# Patient Record
Sex: Male | Born: 1990 | Race: White | Hispanic: No | Marital: Single | Smoking: Current every day smoker
Health system: Southern US, Community
[De-identification: ages and names within clinical notes are randomized; demographics above are authoritative.]

## PROBLEM LIST (undated history)

## (undated) DIAGNOSIS — N200 Calculus of kidney: Secondary | ICD-10-CM

## (undated) DIAGNOSIS — F1910 Other psychoactive substance abuse, uncomplicated: Secondary | ICD-10-CM

## (undated) HISTORY — PX: LITHOTRIPSY: SUR834

---
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
## 2011-12-29 ENCOUNTER — Emergency Department (HOSPITAL_COMMUNITY): Payer: Self-pay

## 2011-12-29 ENCOUNTER — Encounter (HOSPITAL_COMMUNITY): Payer: Self-pay | Admitting: *Deleted

## 2011-12-29 ENCOUNTER — Emergency Department (HOSPITAL_COMMUNITY)
Admission: EM | Admit: 2011-12-29 | Discharge: 2011-12-29 | Disposition: A | Discharge status: Home / Self Care | Payer: Self-pay | Attending: Emergency Medicine | Admitting: Emergency Medicine

## 2011-12-29 DIAGNOSIS — R109 Unspecified abdominal pain: Secondary | ICD-10-CM | POA: Insufficient documentation

## 2011-12-29 DIAGNOSIS — N201 Calculus of ureter: Secondary | ICD-10-CM | POA: Insufficient documentation

## 2011-12-29 HISTORY — DX: Calculus of kidney: N20.0

## 2011-12-29 LAB — CBC WITH DIFFERENTIAL/PLATELET
Eosinophils Absolute: 0.1 10*3/uL (ref 0.0–0.7)
Eosinophils Relative: 1 % (ref 0–5)
Hemoglobin: 15.4 g/dL (ref 13.0–17.0)
Lymphs Abs: 1.6 10*3/uL (ref 0.7–4.0)
MCH: 29.7 pg (ref 26.0–34.0)
MCV: 86.7 fL (ref 78.0–100.0)
Monocytes Absolute: 0.6 10*3/uL (ref 0.1–1.0)
Monocytes Relative: 4 % (ref 3–12)
RBC: 5.18 MIL/uL (ref 4.22–5.81)

## 2011-12-29 LAB — BASIC METABOLIC PANEL
CO2: 28 mEq/L (ref 19–32)
Calcium: 10.8 mg/dL — ABNORMAL HIGH (ref 8.4–10.5)
Glucose, Bld: 134 mg/dL — ABNORMAL HIGH (ref 70–99)
Potassium: 3.9 mEq/L (ref 3.5–5.1)
Sodium: 138 mEq/L (ref 135–145)

## 2011-12-29 LAB — URINALYSIS, ROUTINE W REFLEX MICROSCOPIC
Bilirubin Urine: NEGATIVE
Glucose, UA: NEGATIVE mg/dL

## 2011-12-29 MED ORDER — SODIUM CHLORIDE 0.9 % IV SOLN: INTRAVENOUS | Status: DC

## 2011-12-29 MED ORDER — TAMSULOSIN HCL 0.4 MG PO CAPS: 0.4000 mg | ORAL_CAPSULE | Freq: Two times a day (BID) | ORAL | Status: DC

## 2011-12-29 MED ORDER — KETOROLAC TROMETHAMINE 30 MG/ML IJ SOLN
30.0000 mg | Freq: Once | INTRAMUSCULAR | Status: AC
  Administered 2011-12-29: 30 mg via INTRAVENOUS
  Filled 2011-12-29: qty 1

## 2011-12-29 MED ORDER — ONDANSETRON HCL 4 MG/2ML IJ SOLN
4.0000 mg | Freq: Once | INTRAMUSCULAR | Status: AC
  Administered 2011-12-29: 4 mg via INTRAVENOUS
  Filled 2011-12-29: qty 2

## 2011-12-29 MED ORDER — HYDROMORPHONE HCL PF 1 MG/ML IJ SOLN
1.0000 mg | Freq: Once | INTRAMUSCULAR | Status: AC
  Administered 2011-12-29: 1 mg via INTRAVENOUS
  Filled 2011-12-29: qty 1

## 2011-12-29 MED ORDER — PROMETHAZINE HCL 25 MG PO TABS: 25.0000 mg | ORAL_TABLET | Freq: Four times a day (QID) | ORAL | Status: DC | PRN

## 2011-12-29 MED ORDER — SODIUM CHLORIDE 0.9 % IV BOLUS (SEPSIS): 250.0000 mL | Freq: Once | INTRAVENOUS | Status: DC

## 2011-12-29 MED ORDER — IBUPROFEN 800 MG PO TABS: 800.0000 mg | ORAL_TABLET | Freq: Three times a day (TID) | ORAL | Status: AC

## 2011-12-29 MED ORDER — OXYCODONE-ACETAMINOPHEN 5-325 MG PO TABS: 1.0000 | ORAL_TABLET | Freq: Four times a day (QID) | ORAL | Status: AC | PRN

## 2011-12-29 NOTE — ED Notes (Signed)
Family at bedside. 

## 2011-12-29 NOTE — ED Notes (Signed)
Discharge instructions reviewed with pt, questions answered. Pt verbalized understanding.  

## 2011-12-29 NOTE — ED Notes (Signed)
Pain rt flank and pain in testicle. Nausea, vomiting,x3,  No injury

## 2011-12-29 NOTE — ED Provider Notes (Signed)
History     CSN: [id number]  Arrival date & time 12/29/11  4098   First MD Initiated Contact with Patient 12/29/11 1947      Chief Complaint  Patient presents with  . Flank Pain    (Consider location/radiation/quality/duration/timing/severity/associated sxs/prior treatment) Patient is a 21 y.o. male presenting with flank pain. The history is provided by the patient.  Flank Pain This is a new problem. The current episode started 3 to 5 hours ago. The problem occurs constantly. The problem has been gradually worsening. Associated symptoms include abdominal pain. Pertinent negatives include no chest pain, no headaches and no shortness of breath. Nothing aggravates the symptoms. Nothing relieves the symptoms.   Patient with onset of right flank pain radiating into the right testicle at 1430 today. Patient had a history kidney stone in the past. Patient has vomited 3 times has nausea no diarrhea. No history of injury. Pain is 10 out of 10 as stated radiates into the right testicle. Pain is described as sharp and colicky. Past Medical History  Diagnosis Date  . Kidney stone     History reviewed. No pertinent past surgical history.  History reviewed. No pertinent family history.  History  Substance Use Topics  . Smoking status: Never Smoker   . Smokeless tobacco: Not on file  . Alcohol Use: Yes      Review of Systems  Constitutional: Negative for fever.  HENT: Negative for congestion and neck pain.   Eyes: Negative for redness.  Respiratory: Negative for shortness of breath.   Cardiovascular: Negative for chest pain.  Gastrointestinal: Positive for nausea, vomiting and abdominal pain. Negative for diarrhea.  Genitourinary: Positive for flank pain. Negative for dysuria and hematuria.  Musculoskeletal: Negative for back pain.  Skin: Negative for rash.  Neurological: Negative for headaches.  Hematological: Does not bruise/bleed easily.    Allergies  Review of patient's  allergies indicates no known allergies.  Home Medications   Current Outpatient Rx  Name Route Sig Dispense Refill  . IBUPROFEN 800 MG PO TABS Oral Take 1 tablet (800 mg total) by mouth 3 (three) times daily. 30 tablet 0  . OXYCODONE-ACETAMINOPHEN 5-325 MG PO TABS Oral Take 1-2 tablets by mouth every 6 (six) hours as needed for pain. 15 tablet 0  . PROMETHAZINE HCL 25 MG PO TABS Oral Take 1 tablet (25 mg total) by mouth every 6 (six) hours as needed for nausea. 12 tablet 0    BP 161/94  Pulse 88  Temp 98.7 F (37.1 C) (Oral)  Resp 18  Ht 5\' 7"  (1.702 m)  Wt 145 lb (65.772 kg)  BMI 22.71 kg/m2  SpO2 100%  Physical Exam  Nursing note and vitals reviewed. Constitutional: He is oriented to person, place, and time. He appears well-developed and well-nourished. No distress.  HENT:  Head: Normocephalic and atraumatic.  Mouth/Throat: Oropharynx is clear and moist.  Eyes: Conjunctivae and EOM are normal. Pupils are equal, round, and reactive to light.  Neck: Normal range of motion. Neck supple.  Cardiovascular: Normal rate, regular rhythm and normal heart sounds.   No murmur heard. Pulmonary/Chest: Effort normal and breath sounds normal.  Abdominal: Soft. Bowel sounds are normal. There is no tenderness.  Musculoskeletal: Normal range of motion. He exhibits no edema and no tenderness.  Neurological: He is alert and oriented to person, place, and time. No cranial nerve deficit. He exhibits normal muscle tone. Coordination normal.  Skin: Skin is warm. No rash noted.    ED Course  Procedures (including critical care time)  Labs Reviewed  URINALYSIS, ROUTINE W REFLEX MICROSCOPIC - Abnormal; Notable for the following:    Color, Urine AMBER (*)  BIOCHEMICALS MAY BE AFFECTED BY COLOR   Specific Gravity, Urine >1.030 (*)     Hgb urine dipstick LARGE (*)     Protein, ur TRACE (*)     All other components within normal limits  BASIC METABOLIC PANEL - Abnormal; Notable for the following:      Glucose, Bld 134 (*)     Calcium 10.8 (*)     GFR calc non Af Amer 78 (*)     All other components within normal limits  CBC WITH DIFFERENTIAL - Abnormal; Notable for the following:    WBC 13.6 (*)     Neutrophils Relative 83 (*)     Neutro Abs 11.3 (*)     All other components within normal limits  URINE MICROSCOPIC-ADD ON - Abnormal; Notable for the following:    Bacteria, UA FEW (*)     All other components within normal limits   Ct Abdomen Pelvis Wo Contrast  12/29/2011  *RADIOLOGY REPORT*  Clinical Data: Right flank pain  CT ABDOMEN AND PELVIS WITHOUT CONTRAST  Technique:  Multidetector CT imaging of the abdomen and pelvis was performed following the standard protocol without intravenous contrast.  Comparison: None.  Findings: Lung bases are clear.  No pericardial fluid.  No focal hepatic lesion.  The gallbladder, pancreas, spleen, and adrenal glands are normal.  There is moderate to severe hydronephrosis of the right kidney. There is moderate to severe hydroureter on the right.  This is secondary to an 7 mm calculus within the mid right ureter.  This right ureteral calculus is at the L5 vertebral body level.  This stone is evident on the CT scout.  No left nephrolithiasis.  No bladder stones.  The stomach and bowel are normal.  Appendix is normal.  No free fluid the pelvis.  Prostate gland is normal.  No aggressive osseous lesions.  IMPRESSION:  Large obstructing calculus in the distal right ureter with moderate to severe right hydronephrosis.  Original Report Authenticated By: Genevive Bi, M.D.   Results for orders placed during the hospital encounter of 12/29/11  URINALYSIS, ROUTINE W REFLEX MICROSCOPIC      Component Value Range   Color, Urine AMBER (*) YELLOW   APPearance CLEAR  CLEAR   Specific Gravity, Urine >1.030 (*) 1.005 - 1.030   pH 6.0  5.0 - 8.0   Glucose, UA NEGATIVE  NEGATIVE mg/dL   Hgb urine dipstick LARGE (*) NEGATIVE   Bilirubin Urine NEGATIVE  NEGATIVE    Ketones, ur NEGATIVE  NEGATIVE mg/dL   Protein, ur TRACE (*) NEGATIVE mg/dL   Urobilinogen, UA 0.2  0.0 - 1.0 mg/dL   Nitrite NEGATIVE  NEGATIVE   Leukocytes, UA NEGATIVE  NEGATIVE  BASIC METABOLIC PANEL      Component Value Range   Sodium 138  135 - 145 mEq/L   Potassium 3.9  3.5 - 5.1 mEq/L   Chloride 99  96 - 112 mEq/L   CO2 28  19 - 32 mEq/L   Glucose, Bld 134 (*) 70 - 99 mg/dL   BUN 15  6 - 23 mg/dL   Creatinine, Ser 6.21  0.50 - 1.35 mg/dL   Calcium 30.8 (*) 8.4 - 10.5 mg/dL   GFR calc non Af Amer 78 (*) >90 mL/min   GFR calc Af Amer >90  >90 mL/min  CBC WITH DIFFERENTIAL      Component Value Range   WBC 13.6 (*) 4.0 - 10.5 K/uL   RBC 5.18  4.22 - 5.81 MIL/uL   Hemoglobin 15.4  13.0 - 17.0 g/dL   HCT 82.9  56.2 - 13.0 %   MCV 86.7  78.0 - 100.0 fL   MCH 29.7  26.0 - 34.0 pg   MCHC 34.3  30.0 - 36.0 g/dL   RDW 86.5  78.4 - 69.6 %   Platelets 241  150 - 400 K/uL   Neutrophils Relative 83 (*) 43 - 77 %   Neutro Abs 11.3 (*) 1.7 - 7.7 K/uL   Lymphocytes Relative 12  12 - 46 %   Lymphs Abs 1.6  0.7 - 4.0 K/uL   Monocytes Relative 4  3 - 12 %   Monocytes Absolute 0.6  0.1 - 1.0 K/uL   Eosinophils Relative 1  0 - 5 %   Eosinophils Absolute 0.1  0.0 - 0.7 K/uL   Basophils Relative 0  0 - 1 %   Basophils Absolute 0.0  0.0 - 0.1 K/uL  URINE MICROSCOPIC-ADD ON      Component Value Range   Squamous Epithelial / LPF RARE  RARE   WBC, UA 3-6  <3 WBC/hpf   RBC / HPF TOO NUMEROUS TO COUNT  <3 RBC/hpf   Bacteria, UA FEW (*) RARE     1. Ureter, calculus       MDM   CT scan confirms 7 mm stone in the right ureteral system consistent with his symptoms. BUN/creatinine is normal the CT scan does show hydronephrosis but no evidence of renal insufficiency. Urinalysis consistent with hematuria also consistent with stone. No sniffing and anemia. Patient has a history kidney stone in the past. At 7 mm he may have difficulty passing stone urology followup will be important given  urology referral. We'll go home with pain medicine antinausea medicine and anti-inflammatory medicine. Close followup is important. Work note provided.       Shelda Jakes, MD 12/29/11 2132

## 2011-12-29 NOTE — ED Notes (Signed)
Patient is comfortable. 

## 2013-07-21 ENCOUNTER — Emergency Department (HOSPITAL_COMMUNITY): Payer: Self-pay

## 2013-07-21 ENCOUNTER — Emergency Department (HOSPITAL_COMMUNITY)
Admission: EM | Admit: 2013-07-21 | Discharge: 2013-07-21 | Disposition: A | Discharge status: Home / Self Care | Payer: Self-pay | Attending: Emergency Medicine | Admitting: Emergency Medicine

## 2013-07-21 ENCOUNTER — Encounter (HOSPITAL_COMMUNITY): Payer: Self-pay | Admitting: Emergency Medicine

## 2013-07-21 DIAGNOSIS — N1330 Unspecified hydronephrosis: Secondary | ICD-10-CM | POA: Insufficient documentation

## 2013-07-21 DIAGNOSIS — Z87442 Personal history of urinary calculi: Secondary | ICD-10-CM | POA: Insufficient documentation

## 2013-07-21 DIAGNOSIS — N201 Calculus of ureter: Secondary | ICD-10-CM | POA: Insufficient documentation

## 2013-07-21 DIAGNOSIS — N132 Hydronephrosis with renal and ureteral calculous obstruction: Secondary | ICD-10-CM

## 2013-07-21 LAB — BASIC METABOLIC PANEL
BUN: 17 mg/dL (ref 6–23)
CO2: 29 mEq/L (ref 19–32)
CREATININE: 1.01 mg/dL (ref 0.50–1.35)
Calcium: 9.8 mg/dL (ref 8.4–10.5)
Chloride: 103 mEq/L (ref 96–112)
GLUCOSE: 98 mg/dL (ref 70–99)
POTASSIUM: 4.1 meq/L (ref 3.7–5.3)
Sodium: 141 mEq/L (ref 137–147)

## 2013-07-21 LAB — URINALYSIS, ROUTINE W REFLEX MICROSCOPIC
BILIRUBIN URINE: NEGATIVE
Glucose, UA: NEGATIVE mg/dL
KETONES UR: NEGATIVE mg/dL
Leukocytes, UA: NEGATIVE
NITRITE: NEGATIVE
Protein, ur: NEGATIVE mg/dL
SPECIFIC GRAVITY, URINE: 1.01 (ref 1.005–1.030)
UROBILINOGEN UA: 0.2 mg/dL (ref 0.0–1.0)
pH: 8.5 — ABNORMAL HIGH (ref 5.0–8.0)

## 2013-07-21 LAB — URINE MICROSCOPIC-ADD ON

## 2013-07-21 MED ORDER — MORPHINE SULFATE 4 MG/ML IJ SOLN
4.0000 mg | Freq: Once | INTRAMUSCULAR | Status: AC
  Administered 2013-07-21: 4 mg via INTRAVENOUS
  Filled 2013-07-21: qty 1

## 2013-07-21 MED ORDER — SODIUM CHLORIDE 0.9 % IV BOLUS (SEPSIS)
1000.0000 mL | Freq: Once | INTRAVENOUS | Status: AC
  Administered 2013-07-21: 1000 mL via INTRAVENOUS

## 2013-07-21 MED ORDER — TAMSULOSIN HCL 0.4 MG PO CAPS: 0.4000 mg | ORAL_CAPSULE | Freq: Every day | ORAL | Status: DC

## 2013-07-21 MED ORDER — ONDANSETRON HCL 4 MG/2ML IJ SOLN
4.0000 mg | Freq: Once | INTRAMUSCULAR | Status: AC
  Administered 2013-07-21: 4 mg via INTRAVENOUS
  Filled 2013-07-21: qty 2

## 2013-07-21 MED ORDER — TAMSULOSIN HCL 0.4 MG PO CAPS
0.4000 mg | ORAL_CAPSULE | Freq: Once | ORAL | Status: DC
Start: 2013-07-21 — End: 2013-07-21
  Filled 2013-07-21: qty 1

## 2013-07-21 MED ORDER — OXYCODONE-ACETAMINOPHEN 5-325 MG PO TABS: 1.0000 | ORAL_TABLET | ORAL | Status: DC | PRN

## 2013-07-21 NOTE — ED Provider Notes (Signed)
CSN: [id number]     Arrival date & time 07/21/13  1014 History   First MD Initiated Contact with Patient 07/21/13 1057     Chief Complaint  Patient presents with  . Back Pain     (Consider location/radiation/quality/duration/timing/severity/associated sxs/prior Treatment) HPI Comments: Lynnae JanuaryWilliam Schillaci is a 23 y.o. Male presenting with a three-day history of left sided moderate constant flank pain radiating into his lower left abdomen in association with increased urinary frequency of small amounts of urine.  He denies fevers or chills, no vomiting but has had nausea during episodes of increased sharp pain.  He denies hematuria but has had increased urinary frequency of a small amount of urine.  He has a history of kidney stones, most recently passed a 7 mm stone almost 2 years ago without intervention.  He [facility] [place] with his last episode of kidney stone.  He has found no alleviators for his symptoms. He took naproxen without relief.     The history is provided by the patient and the spouse.    Past Medical History  Diagnosis Date  . Kidney stone    History reviewed. No pertinent past surgical history. History reviewed. No pertinent family history. History  Substance Use Topics  . Smoking status: Never Smoker   . Smokeless tobacco: Never Used  . Alcohol Use: 3.0 oz/week    5 Shots of liquor per week    Review of Systems  Constitutional: Negative for fever and chills.  HENT: Negative for congestion and sore throat.   Eyes: Negative.   Respiratory: Negative for chest tightness and shortness of breath.   Cardiovascular: Negative for chest pain.  Gastrointestinal: Negative for nausea and abdominal pain.  Genitourinary: Positive for dysuria, frequency and flank pain. Negative for urgency, hematuria and discharge.  Musculoskeletal: Negative for arthralgias, joint swelling and neck pain.  Skin: Negative.  Negative for rash and wound.   Neurological: Negative for dizziness, weakness, light-headedness, numbness and headaches.  Psychiatric/Behavioral: Negative.       Allergies  Review of patient's allergies indicates no known allergies.  Home Medications   Current Outpatient Rx  Name  Route  Sig  Dispense  Refill  . naproxen sodium (ANAPROX) 220 MG tablet   Oral   Take 440 mg by mouth 2 (two) times daily as needed.         Marland Kitchen. oxyCODONE-acetaminophen (PERCOCET/ROXICET) 5-325 MG per tablet   Oral   Take 1 tablet by mouth every 4 (four) hours as needed for severe pain.   20 tablet   0   . tamsulosin (FLOMAX) 0.4 MG CAPS capsule   Oral   Take 1 capsule (0.4 mg total) by mouth daily after supper.   10 capsule   0    BP 171/75  Pulse 71  Temp(Src) 98.2 F (36.8 C) (Oral)  Resp 20  Ht 5\' 6"  (1.676 m)  Wt 150 lb (68.04 kg)  BMI 24.22 kg/m2  SpO2 100% Physical Exam  Nursing note and vitals reviewed. Constitutional: He appears well-developed and well-nourished.  HENT:  Head: Normocephalic and atraumatic.  Eyes: Conjunctivae are normal.  Neck: Normal range of motion.  Cardiovascular: Normal rate, regular rhythm, normal heart sounds and intact distal pulses.   Pulmonary/Chest: Effort normal and breath sounds normal. He has no wheezes.  Abdominal: Soft. Bowel sounds are normal. He exhibits no distension. There is tenderness in the left lower quadrant. There is no rigidity, no rebound, no guarding and no  CVA tenderness.  Musculoskeletal: Normal range of motion.  Neurological: He is alert.  Skin: Skin is warm and dry.  Psychiatric: He has a normal mood and affect.    ED Course  Procedures (including critical care time) Labs Review Labs Reviewed  URINALYSIS, ROUTINE W REFLEX MICROSCOPIC - Abnormal; Notable for the following:    APPearance CLOUDY (*)    pH 8.5 (*)    Hgb urine dipstick TRACE (*)    All other components within normal limits  BASIC METABOLIC PANEL  URINE MICROSCOPIC-ADD ON   Imaging  Review Ct Abdomen Pelvis Wo Contrast  07/21/2013   CLINICAL DATA:  Left flank pain since February 27th  EXAM: CT ABDOMEN AND PELVIS WITHOUT CONTRAST  TECHNIQUE: Multidetector CT imaging of the abdomen and pelvis was performed following the standard protocol without intravenous contrast.  COMPARISON:  DG ABDOMEN 1V dated 03/07/2012; CT ABD/PELV WO CM dated 12/29/2011  FINDINGS: Visualized lung bases are clear.  No acute musculoskeletal findings.  Liver, gallbladder, spleen, pancreas, adrenal glands normal. Right kidney demonstrates to lower pole 1 cm rounded areas of low attenuation. Left kidney demonstrates moderately severe hydronephrosis. There is a stone at the ureterovesical junction measuring 8 mm. Bladder is otherwise normal. Reproductive organs are normal. Bowel is normal. No ascites or adenopathy.  IMPRESSION: 1. Moderately severe hydronephrosis on the left due to an 8 mm stone in the left ureterovesical junction. 2. Two low-attenuation lesions lower pole right kidney. These are not seen on prior CT scan. They may represent cysts. Potentially they may have been present but obscured by the hydronephrosis that was present on the right on the prior study. They could be further characterized with ultrasound. A contrast-enhanced study could also further characterize these lesions.   Electronically Signed   By: Esperanza Heir M.D.   On: 07/21/2013 12:22     EKG Interpretation None      MDM   Final diagnoses:  Ureteral stone with hydronephrosis    Patients labs and/or radiological studies were viewed and considered during the medical decision making and disposition process. Pt with a left distal ureteral stone measuring 8 mm with hydronephrosis.  Pt was given a dose of flomax, prescription for additional doses.  Percocet, encouraged to continue with fluid intake and to strain urine.  Multiple attempts to contact [facility] with no response.  Pt and wife advised to contact their office for an  office visit this week.  Advised they have a [place] office tomorrow and on Friday.  In the interim,  Advised immediate return here for increased pain not relieved by medication or fevers/ vomiting, needs immediate recheck.  Pt understands plan.  Urine cx was ordered. Also discussed possible cysts found in right kidney which will require [place] for further eval - pt to discuss with urology this week.  The patient appears reasonably screened and/or stabilized for discharge and I doubt any other medical condition or other [facility] requiring further screening, evaluation, or treatment in the ED at this time prior to discharge.     Burgess Amor, PA-C 07/21/13 [phone]

## 2013-07-21 NOTE — Discharge Instructions (Signed)
Kidney Stones Kidney stones (urolithiasis) are deposits that form inside your kidneys. The intense pain is caused by the stone moving through the urinary tract. When the stone moves, the ureter goes into spasm around the stone. The stone is usually passed in the urine.  CAUSES   A disorder that makes certain neck glands produce too much parathyroid hormone (primary hyperparathyroidism).  A buildup of uric acid crystals, similar to gout in your joints.  Narrowing (stricture) of the ureter.  A kidney obstruction present at birth (congenital obstruction).  Previous surgery on the kidney or ureters.  Numerous kidney infections. SYMPTOMS   Feeling sick to your stomach (nauseous).  Throwing up (vomiting).  Blood in the urine (hematuria).  Pain that usually spreads (radiates) to the groin.  Frequency or urgency of urination. DIAGNOSIS   Taking a history and physical exam.  Blood or urine tests.  CT scan.  Occasionally, an examination of the inside of the urinary bladder (cystoscopy) is performed. TREATMENT   Observation.  Increasing your fluid intake.  Extracorporeal shock wave lithotripsy This is a noninvasive procedure that uses shock waves to break up kidney stones.  Surgery may be needed if you have severe pain or persistent obstruction. There are various surgical procedures. Most of the procedures are performed with the use of small instruments. Only small incisions are needed to accommodate these instruments, so recovery time is minimized. The size, location, and chemical composition are all important variables that will determine the proper choice of action for you. Talk to your health care provider to better understand your situation so that you will minimize the risk of injury to yourself and your kidney.  HOME CARE INSTRUCTIONS   Drink enough water and fluids to keep your urine clear or pale yellow. This will help you to pass the stone or stone fragments.  Strain  all urine through the provided strainer. Keep all particulate matter and stones for your health care provider to see. The stone causing the pain may be as small as a grain of salt. It is very important to use the strainer each and every time you pass your urine. The collection of your stone will allow your health care provider to analyze it and verify that a stone has actually passed. The stone analysis will often identify what you can do to reduce the incidence of recurrences.  Only take over-the-counter or prescription medicines for pain, discomfort, or fever as directed by your health care provider.  Make a follow-up appointment with your health care provider as directed.  Get follow-up X-rays if required. The absence of pain does not always mean that the stone has passed. It may have only stopped moving. If the urine remains completely obstructed, it can cause loss of kidney function or even complete destruction of the kidney. It is your responsibility to make sure X-rays and follow-ups are completed. Ultrasounds of the kidney can show blockages and the status of the kidney. Ultrasounds are not associated with any radiation and can be performed easily in a matter of minutes. SEEK MEDICAL CARE IF:  You experience pain that is progressive and unresponsive to any pain medicine you have been prescribed. SEEK IMMEDIATE MEDICAL CARE IF:   Pain cannot be controlled with the prescribed medicine.  You have a fever or shaking chills.  The severity or intensity of pain increases over 18 hours and is not relieved by pain medicine.  You develop a new onset of abdominal pain.  You feel faint or pass  out.  You are unable to urinate. MAKE SURE YOU:   Understand these instructions.  Will watch your condition.  Will get help right away if you are not doing well or get worse. Document Released: 05/08/2005 Document Revised: 01/08/2013 Document Reviewed: 10/09/2012 [facility] Patient Information 2014  [place] [place]   Strain your urine so you will know if you pass this stone.  Take your next dose of flomax tomorrow evening.  You may take the oxycodone prescribed for pain relief.  This will make you drowsy - do not drive within 4 hours of taking this medication. Get rechecked immediately for any fevers, worse pain or uncontrolled vomiting.  Call [facility] for followup care this week, either in [place] or their [place] office (they are in [place] on Tuesday and Friday).  As discussed,  You have what appears to be a small cyst also in your right kidney which should be evaluated further by an ultrasound. Please discuss this with your urologist this week.

## 2013-07-21 NOTE — ED Notes (Signed)
Patient c/o left back pain that radiates into left flank and left lower abd that started on Friday. Per patient has frequency to urinate but only voids small amounts. Denies any blood in urine. Per patient hx of kidney stones.

## 2013-07-22 LAB — URINE CULTURE
COLONY COUNT: NO GROWTH
Culture: NO GROWTH

## 2013-07-22 NOTE — ED Provider Notes (Signed)
Medical screening examination/treatment/procedure(s) were performed by non-physician practitioner and as supervising physician I was immediately available for consultation/collaboration.   EKG Interpretation None       Doug SouSam Nettie Cromwell, MD 07/22/13 [phone]

## 2013-07-22 NOTE — Care Management (Signed)
Delayed entry 07/21/13 ED/CM noted patient did not have health insurance and/or PCP listed in the computer.  Patient was given the [facility] resource handout with information on the clinics, food pantries, and the handout for new health insurance sign-up.  Patient expressed appreciation for information received.

## 2013-07-25 ENCOUNTER — Emergency Department (HOSPITAL_COMMUNITY): Payer: Self-pay

## 2013-07-25 ENCOUNTER — Emergency Department (HOSPITAL_COMMUNITY)
Admission: EM | Admit: 2013-07-25 | Discharge: 2013-07-26 | Disposition: A | Discharge status: Home / Self Care | Payer: Self-pay | Attending: Emergency Medicine | Admitting: Emergency Medicine

## 2013-07-25 ENCOUNTER — Encounter (HOSPITAL_COMMUNITY): Payer: Self-pay | Admitting: Emergency Medicine

## 2013-07-25 DIAGNOSIS — N211 Calculus in urethra: Secondary | ICD-10-CM

## 2013-07-25 DIAGNOSIS — N201 Calculus of ureter: Secondary | ICD-10-CM | POA: Insufficient documentation

## 2013-07-25 DIAGNOSIS — F411 Generalized anxiety disorder: Secondary | ICD-10-CM | POA: Insufficient documentation

## 2013-07-25 DIAGNOSIS — Z79899 Other long term (current) drug therapy: Secondary | ICD-10-CM | POA: Insufficient documentation

## 2013-07-25 LAB — BASIC METABOLIC PANEL
BUN: 24 mg/dL — ABNORMAL HIGH (ref 6–23)
CO2: 29 meq/L (ref 19–32)
Calcium: 10.1 mg/dL (ref 8.4–10.5)
Chloride: 100 mEq/L (ref 96–112)
Creatinine, Ser: 1.11 mg/dL (ref 0.50–1.35)
GFR calc Af Amer: >90 mL/min (ref >90)
Glucose, Bld: 94 mg/dL (ref 70–99)
Potassium: 3.8 mEq/L (ref 3.7–5.3)
SODIUM: 141 meq/L (ref 137–147)

## 2013-07-25 LAB — URINALYSIS, ROUTINE W REFLEX MICROSCOPIC
Bilirubin Urine: NEGATIVE
Glucose, UA: NEGATIVE mg/dL
Hgb urine dipstick: NEGATIVE
KETONES UR: NEGATIVE mg/dL
LEUKOCYTES UA: NEGATIVE
Nitrite: NEGATIVE
PROTEIN: NEGATIVE mg/dL
Specific Gravity, Urine: 1.02 (ref 1.005–1.030)
UROBILINOGEN UA: 0.2 mg/dL (ref 0.0–1.0)
pH: 6.5 (ref 5.0–8.0)

## 2013-07-25 LAB — CBC WITH DIFFERENTIAL/PLATELET
BASOS PCT: 0 % (ref 0–1)
Basophils Absolute: 0 10*3/uL (ref 0.0–0.1)
Eosinophils Absolute: 0.2 10*3/uL (ref 0.0–0.7)
Eosinophils Relative: 2 % (ref 0–5)
HCT: 48.1 % (ref 39.0–52.0)
Hemoglobin: 16.8 g/dL (ref 13.0–17.0)
LYMPHS PCT: 24 % (ref 12–46)
Lymphs Abs: 2.2 10*3/uL (ref 0.7–4.0)
MCH: 31.1 pg (ref 26.0–34.0)
MCHC: 34.9 g/dL (ref 30.0–36.0)
MCV: 89.1 fL (ref 78.0–100.0)
Monocytes Absolute: 0.6 10*3/uL (ref 0.1–1.0)
Monocytes Relative: 7 % (ref 3–12)
Neutro Abs: 6.3 10*3/uL (ref 1.7–7.7)
Neutrophils Relative %: 68 % (ref 43–77)
PLATELETS: 259 10*3/uL (ref 150–400)
RBC: 5.4 MIL/uL (ref 4.22–5.81)
RDW: 12 % (ref 11.5–15.5)
WBC: 9.4 10*3/uL (ref 4.0–10.5)

## 2013-07-25 MED ORDER — KETOROLAC TROMETHAMINE 30 MG/ML IJ SOLN
30.0000 mg | Freq: Once | INTRAMUSCULAR | Status: AC
  Administered 2013-07-25: 30 mg via INTRAVENOUS
  Filled 2013-07-25: qty 1

## 2013-07-25 MED ORDER — SODIUM CHLORIDE 0.9 % IV BOLUS (SEPSIS)
1000.0000 mL | Freq: Once | INTRAVENOUS | Status: AC
  Administered 2013-07-25: 1000 mL via INTRAVENOUS

## 2013-07-25 MED ORDER — HYDROMORPHONE HCL PF 1 MG/ML IJ SOLN
1.0000 mg | Freq: Once | INTRAMUSCULAR | Status: AC
  Administered 2013-07-25: 1 mg via INTRAVENOUS
  Filled 2013-07-25: qty 1

## 2013-07-25 MED ORDER — TAMSULOSIN HCL 0.4 MG PO CAPS
0.4000 mg | ORAL_CAPSULE | Freq: Once | ORAL | Status: AC
  Administered 2013-07-25: 0.4 mg via ORAL
  Filled 2013-07-25: qty 1

## 2013-07-25 MED ORDER — TAMSULOSIN HCL 0.4 MG PO CAPS
ORAL_CAPSULE | ORAL | Status: AC
  Filled 2013-07-25: qty 1

## 2013-07-25 MED ORDER — OXYCODONE-ACETAMINOPHEN 5-325 MG PO TABS: 2.0000 | ORAL_TABLET | ORAL | Status: DC | PRN

## 2013-07-25 MED ORDER — MORPHINE SULFATE 4 MG/ML IJ SOLN
4.0000 mg | Freq: Once | INTRAMUSCULAR | Status: AC
  Administered 2013-07-25: 4 mg via INTRAVENOUS
  Filled 2013-07-25: qty 1

## 2013-07-25 MED ORDER — OXYCODONE-ACETAMINOPHEN 5-325 MG PO TABS: 1.0000 | ORAL_TABLET | ORAL | Status: DC | PRN

## 2013-07-25 MED ORDER — ONDANSETRON HCL 4 MG/2ML IJ SOLN
4.0000 mg | Freq: Once | INTRAMUSCULAR | Status: AC
  Administered 2013-07-25: 4 mg via INTRAVENOUS
  Filled 2013-07-25: qty 2

## 2013-07-25 MED ORDER — ONDANSETRON HCL 4 MG PO TABS: 4.0000 mg | ORAL_TABLET | Freq: Four times a day (QID) | ORAL | Status: DC

## 2013-07-25 MED ORDER — IBUPROFEN 800 MG PO TABS: 800.0000 mg | ORAL_TABLET | Freq: Three times a day (TID) | ORAL | Status: DC

## 2013-07-25 NOTE — ED Notes (Signed)
Patient with no complaints at this time. Respirations even and unlabored. Skin warm/dry. Discharge instructions reviewed with patient at this time. Patient given opportunity to voice concerns/ask questions. IV removed per policy and band-aid applied to site. Patient discharged at this time and left Emergency Department with steady gait.  

## 2013-07-25 NOTE — ED Provider Notes (Signed)
TIME SEEN: 7:55 PM  CHIEF COMPLAINT: Dysuria, perineal pain   HPI: Patient is a 23 year old male with a history of kidney stones who presents to the emergency department with perineal pain and dysuria that started today. He was seen in the emergency department 4 days ago and diagnosed with a 8 mm left UVJ stone with moderate to severe hydronephrosis. He was instructed to follow up with [facility] which he did not do. He states this morning he woke up in a sweat with worsening pain and nausea. He states he feels like he can't urinate because it hurts to urinate. He denies any gross hematuria. No vomiting or diarrhea. Denies a prior history of ureteral stents, lithotripsy, basket retrieval. No testicular pain or scrotal swelling. No penile discharge.  ROS: See HPI Constitutional: no fever  Eyes: no drainage  ENT: no runny nose   Cardiovascular:  no chest pain  Resp: no SOB  GI: no vomiting GU:  dysuria Integumentary: no rash  Allergy: no hives  Musculoskeletal: no leg swelling  Neurological: no slurred speech ROS otherwise negative  PAST MEDICAL HISTORY/PAST SURGICAL HISTORY:  Past Medical History  Diagnosis Date  . Kidney stone     MEDICATIONS:  Prior to Admission medications   Medication Sig Start Date End Date Taking? Authorizing Provider  naproxen sodium (ANAPROX) 220 MG tablet Take 440 mg by mouth 2 (two) times daily as needed.    Historical Provider, MD  oxyCODONE-acetaminophen (PERCOCET/ROXICET) 5-325 MG per tablet Take 1 tablet by mouth every 4 (four) hours as needed for severe pain. 07/21/13   Burgess AmorJulie Idol, PA-C  tamsulosin (FLOMAX) 0.4 MG CAPS capsule Take 1 capsule (0.4 mg total) by mouth daily after supper. 07/21/13   Burgess AmorJulie Idol, PA-C    ALLERGIES:  No Known Allergies  SOCIAL HISTORY:  History  Substance Use Topics  . Smoking status: Never Smoker   . Smokeless tobacco: Never Used  . Alcohol Use: 3.0 oz/week    5 Shots of liquor per week    FAMILY  HISTORY: History reviewed. No pertinent family history.  EXAM: BP 134/100  Pulse 107  Temp(Src) 98.2 F (36.8 C) (Oral)  Resp 20  Ht 5\' 6"  (1.676 m)  Wt 150 lb (68.04 kg)  BMI 24.22 kg/m2  SpO2 100% CONSTITUTIONAL: Alert and oriented and responds appropriately to questions. Well-appearing; well-nourished HEAD: Normocephalic EYES: Conjunctivae clear, PERRL ENT: normal nose; no rhinorrhea; moist mucous membranes; pharynx without lesions noted NECK: Supple, no meningismus, no LAD  CARD: RRR; S1 and S2 appreciated; no murmurs, no clicks, no rubs, no gallops RESP: Normal chest excursion without splinting or tachypnea; breath sounds clear and equal bilaterally; no wheezes, no rhonchi, no rales,  ABD/GI: Normal bowel sounds; non-distended; soft, non-tender, no rebound, no guarding Normal external genitalia, no penile discharge, no testicular pain or testicular masses, no scrotal swelling or hernia, 2+ femoral pulses bilaterally, patient is very mildly tender to palpation in his perineum with no induration or erythema or warmth or fluctuance BACK:  The back appears normal and is non-tender to palpation, there is no CVA tenderness EXT: Normal ROM in all joints; non-tender to palpation; no edema; normal capillary refill; no cyanosis    SKIN: Normal color for age and race; warm NEURO: Moves all extremities equally PSYCH: The patient's mood and manner are appropriate. Grooming and personal hygiene are appropriate.  MEDICAL DECISION MAKING: Patient here with likely referred pain from his kidney stone. He is hemodynamically stable, nontoxic appearing. We'll repeat labs, urinalysis.  We'll give IV fluids, pain and nausea medication. If patient is unable to urinate or we cannot control his symptoms, may need repeat CT scan.   ED PROGRESS: Patient is able to urinate. His postvoid residual is 32 mL's. Suspect his feeling of urinary retention is secondary to apprehension due to dysuria.   9:30 PM Pt  with continued pain.  His labs and urine are unremarkable. Given his persistent pain it is difficult to control, will repeat a CT scan to evaluate where his 8 mm stone is located currently.   11:39 PM  Pt's pain is better controlled and he appears very comfortable. His CT scan shows a history and is in the urethra. His hydronephrosis has completely resolved. We'll discharge patient home with pain medication. I given him supportive care instructions and return precautions. He states he will followup with his urologist next week. Patient verbalizes understanding and is comfortable plan.  Layla Maw Ward, DO 07/25/13 2340

## 2013-07-25 NOTE — ED Notes (Signed)
Family at bedside. Patient states that he is in pain but can not give a number to it because as long as he is lying still it doesn't bother him.

## 2013-07-25 NOTE — Discharge Instructions (Signed)
Kidney Stones Kidney stones (urolithiasis) are deposits that form inside your kidneys. The intense pain is caused by the stone moving through the urinary tract. When the stone moves, the ureter goes into spasm around the stone. The stone is usually passed in the urine.  CAUSES   A disorder that makes certain neck glands produce too much parathyroid hormone (primary hyperparathyroidism).  A buildup of uric acid crystals, similar to gout in your joints.  Narrowing (stricture) of the ureter.  A kidney obstruction present at birth (congenital obstruction).  Previous surgery on the kidney or ureters.  Numerous kidney infections. SYMPTOMS   Feeling sick to your stomach (nauseous).  Throwing up (vomiting).  Blood in the urine (hematuria).  Pain that usually spreads (radiates) to the groin.  Frequency or urgency of urination. DIAGNOSIS   Taking a history and physical exam.  Blood or urine tests.  CT scan.  Occasionally, an examination of the inside of the urinary bladder (cystoscopy) is performed. TREATMENT   Observation.  Increasing your fluid intake.  Extracorporeal shock wave lithotripsy This is a noninvasive procedure that uses shock waves to break up kidney stones.  Surgery may be needed if you have severe pain or persistent obstruction. There are various surgical procedures. Most of the procedures are performed with the use of small instruments. Only small incisions are needed to accommodate these instruments, so recovery time is minimized. The size, location, and chemical composition are all important variables that will determine the proper choice of action for you. Talk to your health care provider to better understand your situation so that you will minimize the risk of injury to yourself and your kidney.  HOME CARE INSTRUCTIONS   Drink enough water and fluids to keep your urine clear or pale yellow. This will help you to pass the stone or stone fragments.  Strain  all urine through the provided strainer. Keep all particulate matter and stones for your health care provider to see. The stone causing the pain may be as small as a grain of salt. It is very important to use the strainer each and every time you pass your urine. The collection of your stone will allow your health care provider to analyze it and verify that a stone has actually passed. The stone analysis will often identify what you can do to reduce the incidence of recurrences.  Only take over-the-counter or prescription medicines for pain, discomfort, or fever as directed by your health care provider.  Make a follow-up appointment with your health care provider as directed.  Get follow-up X-rays if required. The absence of pain does not always mean that the stone has passed. It may have only stopped moving. If the urine remains completely obstructed, it can cause loss of kidney function or even complete destruction of the kidney. It is your responsibility to make sure X-rays and follow-ups are completed. Ultrasounds of the kidney can show blockages and the status of the kidney. Ultrasounds are not associated with any radiation and can be performed easily in a matter of minutes. SEEK MEDICAL CARE IF:  You experience pain that is progressive and unresponsive to any pain medicine you have been prescribed. SEEK IMMEDIATE MEDICAL CARE IF:   Pain cannot be controlled with the prescribed medicine.  You have a fever or shaking chills.  The severity or intensity of pain increases over 18 hours and is not relieved by pain medicine.  You develop a new onset of abdominal pain.  You feel faint or pass  out.  You are unable to urinate. MAKE SURE YOU:   Understand these instructions.  Will watch your condition.  Will get help right away if you are not doing well or get worse. Document Released: 05/08/2005 Document Revised: 01/08/2013 Document Reviewed: 10/09/2012 [facility] Patient Information 2014  [place].  Diet for Kidney Stones Kidney stones are small, hard masses that form inside your kidneys. They are made up of salts and minerals and often form when high levels build up in the urine. The minerals can then start to build up, crystalize, and stick together to form stones. There are several different types of kidney stones. The following types of stones may be influenced by dietary factors:   Calcium Oxalate Stones. An oxalate is a salt found in certain foods. Within the body, calcium can combine with oxalates to form calcium oxalate stones, which can be excreted in the urine in high amounts. This is the most common type of kidney stone.  Calcium Phosphate Stones. These stones may occur when the pH of the urine becomes too high, or less acidic, from too much calcium being excreted in the urine. The pH is a measure of how acidic or basic a substance is.  Uric Acid Stones. This type of stone occurs when the pH of the urine becomes too low, or very acidic, because substances called purines build up in the urine. Purines are found in animal proteins. When the urine is highly concentrated with acid, uric acid kidney stones can form.  Other risk factors for kidney stones include genetics, environment, and being overweight. Your caregiver may ask you to follow specific diet guidelines based on the type of stone you have to lessen the chances of your body making more kidney stones.  GENERAL GUIDELINES FOR ALL TYPES OF STONES  Drink plenty of fluid. Drink 12 16 cups of fluid a day, drinking mainly water.This is the most important thing you can do to prevent the formation of future kidney stones.  Maintain a healthy weight. Your caregiver or dietitian can help you determine what a healthy weight is for you. If you are overweight, weight loss may help prevent the formation of future kidney stones.  Eat a diet adequate in animal protein. Too much animal protein can contribute to the formation  of stones. Your dietitian can help you determine how much protein you should be eating. Avoid low carbohydrate, high protein diets.  Follow a balanced eating approach. The DASH diet, which stands for "Dietary Approaches to Stop Hypertension," is an effective meal plan for reducing stone formation. This diet is high in fruits, vegetables, dairy, and whole grains and low in animal protein. Ask your caregiver or dietitian for information about the DASH diet. ADDITIONAL DIET GUIDELINES FOR CALCIUM STONES Avoid foods high in salt. This includes table salt, salt seasonings, MSG, soy sauce, cured and processed meats, salted crackers and snack foods, fast food, and canned soups and foods. Ask your caregiver or dietitian for information about reducing sodium in your diet or following the low sodium diet.  Ensure adequate calcium intake. Use the following table for calcium guidelines:  Men 81 years old and younger  1000 mg/day.  Men 46 years old and older  1500 mg/day.  Women 51 23 years old  1000 mg/day.  Women 50 years and older  1500 mg/day. Your dietitian can help you determine if you are getting enough calcium in your diet. Foods that are high in calcium include dairy products, broccoli, cheese, yogurt, and  pudding. If you need to take a calcium supplement, take it only in the form of calcium citrate.  Avoid foods high in oxalate. Be sure that any supplements you take do not contain more than 500 mg of vitamin C. Vitamin C is converted into oxalate in the body. You do not need to avoid fruits and vegetables high in vitamin C.   Grains: High-fiber or bran cereal, whole-wheat bread, grits, barley, buckwheat, amaranth, pretzels, and fruitcake.  Vegetables: Dried beans, wax beans, dark leafy greens, eggplant, leeks, okra, parsley, rutabaga, tomato paste, watercress, zucchini, and escarole.  Fruit: Dried apricots, red currants, figs, kiwi, and rhubarb.  Meat and Meat Substitutes: Soybeans and foods made  from soy (soyburger, miso), dried beans, peanut butter.  Milk: Chocolate milk mixes and soymilk.  Fats and Oils: Nuts (peanuts, almonds, pecans, cashews, hazelnuts) and nut butters, sesame seeds, and tDahini paste.  Condiments/Miscellaneous: Chocolate, carob, marmalade, poppy seeds, instant iced tea, and juice from high-oxalate fruits.  Document Released: 09/02/2010 Document Revised: 11/07/2011 Document Reviewed: 10/23/2011 [facility] Patient Information 2014 [place], [place].  Urethritis, Adult Urethritis is an inflammation of the tube through which urine exits your bladder (urethra).  CAUSES Urethritis is often caused by an infection in your urethra. The infection can be viral, like herpes. The infection can also be bacterial, like gonorrhea. RISK FACTORS Risk factors of urethritis include:  Having sex without using a condom.  Having multiple sexual partners.  Having poor hygiene. SIGNS AND SYMPTOMS Symptoms of urethritis are less noticeable in women than in men. These symptoms include:  Burning feeling when you urinate (dysuria).  Discharge from your urethra.  Blood in your urine (hematuria).  Urinating more than usual. DIAGNOSIS  To confirm a diagnosis of urethritis, your health care provider will do the following:  Ask about your sexual history.  Perform a physical exam.  Have you provide a sample of your urine for lab testing.  Use a cotton swab to gently collect a sample from your urethra for lab testing. TREATMENT  It is important to treat urethritis. Depending on the cause, untreated urethritis may lead to serious genital infections and possibly infertility. Urethritis caused by a bacterial infection is treated with antibiotics. All sexual partners must be treated.  HOME CARE INSTRUCTIONS  Do not have sex until the test results are known and treatment is completed, even if your symptoms go away before you finish treatment.  Finish all medicines that you are  prescribed. SEEK MEDICAL CARE IF:   Your symptoms are not improved in 3 days.  Your symptoms are getting worse.  You develop abdominal pain or pelvic pain (in women).  You develop joint pain. SEEK IMMEDIATE MEDICAL CARE IF:   You have a fever with a temperature of 101.9F (38.8C) or greater.  You have severe pain in the belly, back, or side.  You have repeated vomiting. Document Released: 11/01/2000 Document Revised: 02/26/2013 Document Reviewed: 01/06/2013 [facility] Patient Information 2014 [place], [place].

## 2013-07-25 NOTE — ED Notes (Signed)
Seen here Monday, and dx with kidney stone, Now having pain in scrotum. Onset this am.  Feels anxous

## 2013-08-01 MED FILL — Oxycodone w/ Acetaminophen Tab 5-325 MG: ORAL | Qty: 6 | Status: AC

## 2014-09-08 ENCOUNTER — Emergency Department (HOSPITAL_COMMUNITY)
Admission: EM | Admit: 2014-09-08 | Discharge: 2014-09-08 | Disposition: A | Discharge status: Home / Self Care | Payer: Self-pay | Attending: Emergency Medicine | Admitting: Emergency Medicine

## 2014-09-08 ENCOUNTER — Encounter (HOSPITAL_COMMUNITY): Payer: Self-pay | Admitting: Emergency Medicine

## 2014-09-08 ENCOUNTER — Emergency Department (HOSPITAL_COMMUNITY): Payer: Self-pay

## 2014-09-08 DIAGNOSIS — R0600 Dyspnea, unspecified: Secondary | ICD-10-CM

## 2014-09-08 DIAGNOSIS — I10 Essential (primary) hypertension: Secondary | ICD-10-CM | POA: Insufficient documentation

## 2014-09-08 DIAGNOSIS — Z87442 Personal history of urinary calculi: Secondary | ICD-10-CM | POA: Insufficient documentation

## 2014-09-08 DIAGNOSIS — Z791 Long term (current) use of non-steroidal anti-inflammatories (NSAID): Secondary | ICD-10-CM | POA: Insufficient documentation

## 2014-09-08 DIAGNOSIS — Z720 Tobacco use: Secondary | ICD-10-CM | POA: Insufficient documentation

## 2014-09-08 DIAGNOSIS — R0602 Shortness of breath: Secondary | ICD-10-CM | POA: Insufficient documentation

## 2014-09-08 LAB — BASIC METABOLIC PANEL
Anion gap: 10 (ref 5–15)
BUN: 20 mg/dL (ref 6–23)
CO2: 23 mmol/L (ref 19–32)
Calcium: 9.9 mg/dL (ref 8.4–10.5)
Chloride: 107 mmol/L (ref 96–112)
Creatinine, Ser: 1.27 mg/dL (ref 0.50–1.35)
GFR calc Af Amer: >90 mL/min (ref >90)
GFR calc non Af Amer: 78 mL/min — ABNORMAL LOW (ref >90)
Glucose, Bld: 110 mg/dL — ABNORMAL HIGH (ref 70–99)
Potassium: 3.7 mmol/L (ref 3.5–5.1)
Sodium: 140 mmol/L (ref 135–145)

## 2014-09-08 LAB — CBC
HCT: 47.1 % (ref 39.0–52.0)
Hemoglobin: 16.4 g/dL (ref 13.0–17.0)
MCH: 31.1 pg (ref 26.0–34.0)
MCHC: 34.8 g/dL (ref 30.0–36.0)
MCV: 89.4 fL (ref 78.0–100.0)
Platelets: 233 10*3/uL (ref 150–400)
RBC: 5.27 MIL/uL (ref 4.22–5.81)
RDW: 11.9 % (ref 11.5–15.5)
WBC: 8.5 10*3/uL (ref 4.0–10.5)

## 2014-09-08 MED ORDER — LISINOPRIL 10 MG PO TABS: 10.0000 mg | ORAL_TABLET | Freq: Every day | ORAL | Status: DC

## 2014-09-08 NOTE — Discharge Instructions (Signed)
Shortness of Breath Shortness of breath means you have trouble breathing. Shortness of breath needs medical care right away. HOME CARE   Do not smoke.  Avoid being around chemicals or things (paint fumes, dust) that may bother your breathing.  Rest as needed. Slowly begin your normal activities.  Only take medicines as told by your doctor.  Keep all doctor visits as told. GET HELP RIGHT AWAY IF:   Your shortness of breath gets worse.  You feel lightheaded, pass out (faint), or have a cough that is not helped by medicine.  You cough up blood.  You have pain with breathing.  You have pain in your chest, arms, shoulders, or belly (abdomen).  You have a fever.  You cannot walk up stairs or exercise the way you normally do.  You do not get better in the time expected.  You have a hard time doing normal activities even with rest.  You have problems with your medicines.  You have any new symptoms. MAKE SURE YOU:  Understand these instructions.  Will watch your condition.  Will get help right away if you are not doing well or get worse. Document Released: 10/25/2007 Document Revised: 05/13/2013 Document Reviewed: 07/24/2011 [facility] Patient Information 2015 [place], [place]. This information is not intended to replace advice given to you by your health care provider. Make sure you discuss any questions you have with your health care provider.  Hypertension Hypertension is another name for high blood pressure. High blood pressure forces your heart to work harder to pump blood. A blood pressure reading has two numbers, which includes a higher number over a lower number (example: 110/72). HOME CARE   Have your blood pressure rechecked by your doctor.  Only take medicine as told by your doctor. Follow the directions carefully. The medicine does not work as well if you skip doses. Skipping doses also puts you at risk for problems.  Do not smoke.  Monitor your blood pressure  at home as told by your doctor. GET HELP IF:  You think you are having a reaction to the medicine you are taking.  You have repeat headaches or feel dizzy.  You have puffiness (swelling) in your ankles.  You have trouble with your vision. GET HELP RIGHT AWAY IF:   You get a very bad headache and are confused.  You feel weak, numb, or faint.  You get chest or belly (abdominal) pain.  You throw up (vomit).  You cannot breathe very well. MAKE SURE YOU:   Understand these instructions.  Will watch your condition.  Will get help right away if you are not doing well or get worse. Document Released: 10/25/2007 Document Revised: 05/13/2013 Document Reviewed: 02/28/2013 [facility] Patient Information 2015 [place], [place]. This information is not intended to replace advice given to you by your health care provider. Make sure you discuss any questions you have with your health care provider.   Emergency Department Resource Guide 1) Find a Doctor and Pay Out of Pocket Although you won't have to find out who is covered by your insurance plan, it is a good idea to ask around and get recommendations. You will then need to call the office and see if the doctor you have chosen will accept you as a new patient and what types of options they offer for patients who are self-pay. Some doctors offer discounts or will set up payment plans for their patients who do not have insurance, but you will need to ask so you  aren't surprised when you get to your appointment.  2) Contact Your Local Health Department Not all health departments have doctors that can see patients for sick visits, but many do, so it is worth a call to see if yours does. If you don't know where your local health department is, you can check in your phone book. The CDC also has a tool to help you locate your state's health department, and many state websites also have listings of all of their local health departments.  3) Find a  Walk-in Clinic If your illness is not likely to be very severe or complicated, you may want to try a walk in clinic. These are popping up all over the country in pharmacies, drugstores, and shopping centers. They're usually staffed by nurse practitioners or physician assistants that have been trained to treat common illnesses and complaints. They're usually fairly quick and inexpensive. However, if you have serious medical issues or chronic medical problems, these are probably not your best option.  No Primary Care Doctor: - Call Health Connect at  [phone] - they can help you locate a primary care doctor that  accepts your insurance, provides certain services, etc. - Physician Referral Service- [phone]  Chronic Pain Problems: Organization         Address  Phone   Notes  [facility]  [phone] Patients need to be referred by their primary care doctor.   Medication Assistance: Organization         Address  Phone   Notes  [facility] Medication [facility] [address] [place] [place], [place] [address] [phone] --Must be a resident of [facility] -- Must have NO insurance coverage whatsoever (no Medicaid/ Medicare, etc.) -- The pt. MUST have a primary care doctor that directs their care regularly and follows them in the community   [facility]  [phone]   [employer]  [phone]    Agencies that provide inexpensive medical care: Organization         Address  Phone   Notes  [facility]  [phone]   [facility]    [phone]   [facility] [address] [place], [place] [address] [phone]   [facility] [address] [place] [phone]   Planned Parenthood    [phone]   [facility]    [phone]   [facility]  [address], [place] Phone:  [phone], Fax:  [phone] Hours of Operation:  9 am - 6 pm, M-F.  Also accepts Medicaid/Medicare and self-pay.  [facility]  [address], [place] Phone: [phone], Fax: [phone]. Hours of Operation:  8:30 am - 5:30 pm, M-F.  Also accepts Medicaid and self-pay.  [facility] [place] [address] [place] Phone: [phone]   [facility] [address] Natasha Bence [place], [place] [phone], Ext. [phone] Mondays & Thursdays: 7-9 AM.  First 15 patients are seen on a first come, first serve basis.    Medicaid-accepting [facility] Providers:  Organization         Address  Phone   Notes  [facility] [address], [place] [phone] Also accepts self-pay patients.  [facility] [address] Laurell Josephs [place], [place]  [phone]  [facility] [address], [place] ([phone]) [phone]   °[facility] [address] [place] [address], [place] (336) [phone]   °Veita Bland [address], [place]  ° ([phone]) [phone] Only accepts [place] Access Medicaid patients after they have their name applied to their card.  ° °Self-Pay (no insurance) in [place]: ° °Organization         Address  Phone   Notes  °Sickle Cell Patients, [facility] [address], [place] ([phone]) [phone]   °[facility] [address], [place] (336) [phone]   °[facility] [place] [facility] ° [address], [place] ([phone]) [phone]   °[facility]/Dr. Osei-Bonsu ° [address] [place] [address], [place] or [address], [place] ([phone]) [phone] Phone number for both [place] and [place] locations is the same.  °[facility] [address], [place] ([phone]) [phone]   °[facility] [address] [place] [address], [place] or [address] (336) 852-7530 °(336) [phone]     °[facility] [address], [place] (336) 350-1642, phone; ([phone]) [phone], fax Sees patients 1st and 3rd Saturday of every month.  Must not qualify for public or private insurance (i.e. Medicaid, Medicare, [place] Health Choice, Veterans' Benefits) • Household income should be no more than 200% of the poverty level •The clinic cannot treat you if you are pregnant or think you are pregnant • Sexually transmitted diseases are not treated at the clinic.  ° ° °Dental Care: °Organization         Address  Phone  Notes  °[facility] [address],  (336) [phone] Accepts children up to age 21 who are enrolled in Medicaid or [place] Health Choice; pregnant women with a Medicaid card; and children who have applied for Medicaid or [place] Health Choice, but were declined, whose parents can pay a reduced fee at time of service.  °[facility] [place]  [address], [place] (336) 641-7733 Accepts children up to age 21 who are enrolled in Medicaid or  Health Choice; pregnant women with a Medicaid card; and children who have applied for Medicaid or  Health Choice, but were declined, whose parents can pay a reduced fee at time of service.  °[facility] ° [address],  (336) 641-4533 Patients are seen by appointment only. Walk-ins are not accepted. [facility] will see patients 18 years of age and older. °Monday - Tuesday (8am-5pm) °Most Wednesdays (8:30-5pm) °$30 per visit, cash only  °[facility] ° [address], [place] (336) [phone] Patients are seen by appointment only. Walk-ins are not accepted. [facility] will see patients 18 years of age and older. °One Wednesday Evening (Monthly: Volunteer Based).  $30 per visit, cash only  °[facility]  ([phone]) [phone] for adults; Children under age 4, call  Graduate Pediatric Dentistry at [phone]. Children aged 4-14, please call [phone] to request a pediatric application. ° Dental services are provided in all areas of dental care including fillings, crowns and bridges, complete and partial dentures, implants, gum treatment, root canals, and extractions. Preventive care is also provided. Treatment is provided to both adults and children. °Patients are selected via a lottery and there is often a waiting   list. °  °[facility] [address], °[place] ° (336) 763-8833 [email or web address].[email or web address] °  °[facility] [address], [place], [place] (336)723-1848, Ext. [phone] Second and Fourth Thursday of each month, opens at 6:30 AM; Clinic ends at 9 AM.  Patients are seen on a first-come first-served basis, and a limited number are seen during each clinic.  ° °[facility] ° [address], [place], [place] (336) [phone]   Eligibility Requirements °You must have lived in [place], [place], or [place] counties for at least the last three months. °  You cannot be eligible for state or federal sponsored healthcare insurance, including Veterans Administration, Medicaid, or Medicare. °  You generally cannot be eligible for healthcare insurance through your employer.  °  How to apply: °Eligibility screenings are held every Tuesday and Wednesday afternoon from 1:00 pm until 4:00 pm. You do not need an appointment for the interview!  °[facility] [address], [place], [place] 336-631-2330   °[facility]  336-342-8273   °[facility]  336-703-3100   °[facility]  336-570-6415   ° °Behavioral Health Resources in the Community: °Intensive Outpatient Programs °Organization         Address  Phone  Notes  °[facility] [address], [place], [place] 336-878-6098   °[facility] [address], [place], [place]  336-832-9800   °[facility] [address], [place], [place] ° 336-882-2125   °[facility] [address],  °[place], [place] 1-800-853-5163 or 336-641-4981   °Substance Abuse Resources °Organization         Address  Phone  Notes  °[facility]  336-882-2125   °[facility]  336-784-9470   °[facility]  336-285-9073   °[facility]  336-845-3988   °Residential & Outpatient Substance Abuse Program  1-800-659-3381   °Psychological Services °Organization         Address  Phone  Notes  °[facility]  336- 832-9600   °[facility]  336- 378-7881   °[facility] [address], [place] 1-800-853-5163 or 336-641-4981   ° °Mobile Crisis Teams °Organization         Address  Phone  Notes  °[facility], Mobile Crisis Care Unit  1-877-626-1772   °Assertive °[facility] ° [address] [place], [place] 336-834-9664   °Sharon DeEsch [address] °[place] 336-554-5454   ° °Self-Help/Support Groups °Organization         Address  Phone             Notes  °[facility] [place] - variety of support groups  336- 373-1402 Call for more information  °Narcotics Anonymous (NA), [facility] [address], °[place]  2 meetings at this location  ° °Residential Treatment Programs °Organization         Address  Phone  Notes  °[facility] [address],    °[place]  1-866-801-8205   °[facility] ° [address], [place], [place] 704-293-8524   °[facility] [address], [place] 336-845-3988 Admissions: 8am-3pm M-F  °[facility] [address]    °[place], [place] 336-841-1104   °[facility] [address], [place], [place] 336-379-7146   °[facility]   [address]  °[place], [place] 336-285-9073   °[facility] - Intensive Outpatient [address], [place], [place] 336-852-3033   °[facility] [address]  °[place], [place] 1-877-615-2722 or 336-784-9470   °[facility] ([facility]) [address] [place], [place] 336-227-7417 Accepts Medicaid  °[facility] [address]  °[place] 1-800-659-3381 Substance Abuse/Addiction Treatment  ° °[place] Behavioral Health Resources °Organization         Address  Phone  Notes  °[facility]  (888) 581-9988   °Julie Brannon, PhD [address] [place], [place]   (336) 349-5553 or (336) 951-0000   °[facility]   [address] °[place], [place] (336) 349-4454   °[facility] [address], [place], [place] (336) 342-8316 Insurance/Medicaid/sponsorship through [facility]  °[facility] [address]                                    [place], [place] (336) 342-8316 Therapy/tele-psych/case  °[facility] [address]  ° [place], [place] (336) 349-2233    °Dr. Arfeen  (336) 349-4544   °[facility] [place]  [employer] [facility] [facility] 1) [address], [place] °2) [address], [place] °3)  [address], [place] (336) 349-3220 °(336) 342-7768 ° °(336) 342-8140   °[place] [facility] (336) 342-1394 or (336) 342-3537 (After Hours)    ° ° ° °

## 2014-09-08 NOTE — ED Notes (Addendum)
Pt reports fatigue,sob x2 months. Pt reports " i think my blood pressure is up too." nad noted.

## 2014-09-09 NOTE — ED Provider Notes (Signed)
CSN: [id number]     Arrival date & time 09/08/14  1700 History   First MD Initiated Contact with Patient 09/08/14 1755     Chief Complaint  Patient presents with  . Fatigue     (Consider location/radiation/quality/duration/timing/severity/associated sxs/prior Treatment) HPI   24 year old male with generalized fatigue. This feels like he has no energy. Mildly short of breath at times. Patient does physical labor cutting trees. Denies that his shortness of breath is slightly worse when exerting himself. No chest pain. No fevers or chills. No history of anemia. No known thyroid dysfunction. Patient is also concerned about his blood pressure. He reports that he's been told on numerous occasion that he has high blood pressure. Past Medical History  Diagnosis Date  . Kidney stone    History reviewed. No pertinent past surgical history. History reviewed. No pertinent family history. History  Substance Use Topics  . Smoking status: Current Every Day Smoker -- 0.10 packs/day    Types: Cigarettes  . Smokeless tobacco: Never Used  . Alcohol Use: 3.0 oz/week    5 Shots of liquor per week    Review of Systems    Allergies  Review of patient's allergies indicates no known allergies.  Home Medications   Prior to Admission medications   Medication Sig Start Date End Date Taking? Authorizing Provider  naproxen sodium (ANAPROX) 220 MG tablet Take 440 mg by mouth 2 (two) times daily as needed.   Yes Historical Provider, MD  ibuprofen (ADVIL,MOTRIN) 800 MG tablet Take 1 tablet (800 mg total) by mouth 3 (three) times daily. Patient not taking: Reported on 09/08/2014 07/25/13   Layla MawKristen N Ward, DO  lisinopril (PRINIVIL,ZESTRIL) 10 MG tablet Take 1 tablet (10 mg total) by mouth daily. 09/08/14   Raeford RazorStephen Mykela Mewborn, MD  ondansetron (ZOFRAN) 4 MG tablet Take 1 tablet (4 mg total) by mouth every 6 (six) hours. Patient not taking: Reported on 09/08/2014 07/25/13   Layla MawKristen N Ward, DO  oxyCODONE-acetaminophen  (PERCOCET/ROXICET) 5-325 MG per tablet Take 1 tablet by mouth every 4 (four) hours as needed for severe pain. Patient not taking: Reported on 09/08/2014 07/21/13   Burgess AmorJulie Idol, PA-C  oxyCODONE-acetaminophen (PERCOCET/ROXICET) 5-325 MG per tablet Take 1-2 tablets by mouth every 4 (four) hours as needed. Patient not taking: Reported on 09/08/2014 07/25/13   Layla MawKristen N Ward, DO  oxyCODONE-acetaminophen (PERCOCET/ROXICET) 5-325 MG per tablet Take 2 tablets by mouth every 4 (four) hours as needed for severe pain. Patient not taking: Reported on 09/08/2014 07/25/13   Kristen N Ward, DO   BP 133/93 mmHg  Pulse 64  Temp(Src) 97.5 F (36.4 C) (Oral)  Resp 16  Ht 5\' 7"  (1.702 m)  Wt 148 lb (67.132 kg)  BMI 23.17 kg/m2  SpO2 99% Physical Exam  Constitutional: He is oriented to person, place, and time. He appears well-developed and well-nourished. No distress.  HENT:  Head: Normocephalic and atraumatic.  Eyes: Conjunctivae are normal. Right eye exhibits no discharge. Left eye exhibits no discharge.  Neck: Neck supple.  Cardiovascular: Normal rate, regular rhythm and normal heart sounds.  Exam reveals no gallop and no friction rub.   No murmur heard. Pulmonary/Chest: Effort normal and breath sounds normal. No respiratory distress.  Abdominal: Soft. He exhibits no distension. There is no tenderness.  Musculoskeletal: He exhibits no edema or tenderness.  Neurological: He is alert and oriented to person, place, and time. No cranial nerve deficit. He exhibits normal muscle tone. Coordination normal.  Skin: Skin is warm and dry.  No rash noted.  Psychiatric: He has a normal mood and affect. His behavior is normal. Thought content normal.  Nursing note and vitals reviewed.   ED Course  Procedures (including critical care time) Labs Review Labs Reviewed  BASIC METABOLIC PANEL - Abnormal; Notable for the following:    Glucose, Bld 110 (*)    GFR calc non Af Amer 78 (*)    All other components within normal  limits  CBC    Imaging Review Dg Chest 2 View  09/08/2014   CLINICAL DATA:  Intermittent shortness of breath and elevated blood pressure for 1 month.  EXAM: CHEST  2 VIEW  COMPARISON:  12/19/2013.  FINDINGS: The cardiomediastinal silhouette is within normal limits. The lungs are well inflated and clear. There is no evidence of pleural effusion or pneumothorax. No acute osseous abnormality is identified.  IMPRESSION: No active cardiopulmonary disease.   Electronically Signed   By: Sebastian Ache   On: 09/08/2014 19:14     EKG Interpretation   Date/Time:  Tuesday September 08 2014 17:11:31 EDT Ventricular Rate:  63 PR Interval:  152 QRS Duration: 114 QT Interval:  380 QTC Calculation: 388 R Axis:   104 Text Interpretation:  Sinus rhythm with marked sinus arrhythmia Rightward  axis Non-specific ST-t changes no old Confirmed by Juleen China  MD, Danise Dehne  ([id number]) on 09/08/2014 5:21:54 PM      MDM   Final diagnoses:  Dyspnea  Essential hypertension    24 year old male with generalized fatigue. Basic screening labs obtained in fairly unremarkable. Patient has had persistent hypertension noted on numerous visits. Will start him on low-dose medication. Resources provided.    Raeford Razor, MD 09/09/14 [phone]

## 2015-02-09 ENCOUNTER — Emergency Department (HOSPITAL_COMMUNITY)
Admission: EM | Admit: 2015-02-09 | Discharge: 2015-02-09 | Disposition: A | Discharge status: Home / Self Care | Payer: Self-pay | Attending: Emergency Medicine | Admitting: Emergency Medicine

## 2015-02-09 ENCOUNTER — Emergency Department (HOSPITAL_COMMUNITY): Payer: Self-pay

## 2015-02-09 ENCOUNTER — Encounter (HOSPITAL_COMMUNITY): Payer: Self-pay | Admitting: Emergency Medicine

## 2015-02-09 DIAGNOSIS — X58XXXA Exposure to other specified factors, initial encounter: Secondary | ICD-10-CM | POA: Insufficient documentation

## 2015-02-09 DIAGNOSIS — S43401A Unspecified sprain of right shoulder joint, initial encounter: Secondary | ICD-10-CM | POA: Insufficient documentation

## 2015-02-09 DIAGNOSIS — Y998 Other external cause status: Secondary | ICD-10-CM | POA: Insufficient documentation

## 2015-02-09 DIAGNOSIS — Y9389 Activity, other specified: Secondary | ICD-10-CM | POA: Insufficient documentation

## 2015-02-09 DIAGNOSIS — Z720 Tobacco use: Secondary | ICD-10-CM | POA: Insufficient documentation

## 2015-02-09 DIAGNOSIS — Y9289 Other specified places as the place of occurrence of the external cause: Secondary | ICD-10-CM | POA: Insufficient documentation

## 2015-02-09 DIAGNOSIS — Z87442 Personal history of urinary calculi: Secondary | ICD-10-CM | POA: Insufficient documentation

## 2015-02-09 MED ORDER — IBUPROFEN 800 MG PO TABS
800.0000 mg | ORAL_TABLET | Freq: Once | ORAL | Status: AC
  Administered 2015-02-09: 800 mg via ORAL
  Filled 2015-02-09: qty 1

## 2015-02-09 MED ORDER — HYDROCODONE-ACETAMINOPHEN 5-325 MG PO TABS
1.0000 | ORAL_TABLET | Freq: Once | ORAL | Status: AC
  Administered 2015-02-09: 1 via ORAL
  Filled 2015-02-09: qty 1

## 2015-02-09 MED ORDER — HYDROCODONE-ACETAMINOPHEN 5-325 MG PO TABS: ORAL_TABLET | ORAL | Status: DC

## 2015-02-09 NOTE — ED Notes (Addendum)
Pt reports right shoulder pain since moving boxes yesterday.pt reports felt a  "pop" in right shoulder. No deformity noted. Radial pulse strong. Cap refill <3 secs. nad noted.

## 2015-02-09 NOTE — ED Provider Notes (Signed)
CSN: [id number]     Arrival date & time 02/09/15  1142 History   First MD Initiated Contact with Patient 02/09/15 1149     Chief Complaint  Patient presents with  . Shoulder Pain     (Consider location/radiation/quality/duration/timing/severity/associated sxs/prior Treatment) HPI  Brett Liu is a 24 y.o. male who presents to the Emergency Department complaining of sudden onset of the right shoulder pain that began yesterday after reaching overhead to move heavy boxes.  States as he was lifting, he felt a sharp pain and heard a "pop" in his shoulder and pain has been persistent ever since.  Pain is worsened with reaching outward or overhead with the right arm  Pain resolves with the right arm held to his chest.  He has tried ibuprofen with mild, temporary relief.  He denies deformity, swelling, redness, neck or chest pain.  No previous right shoulder injuries.     Past Medical History  Diagnosis Date  . Kidney stone    History reviewed. No pertinent past surgical history. History reviewed. No pertinent family history. Social History  Substance Use Topics  . Smoking status: Current Every Day Smoker -- 0.50 packs/day    Types: Cigarettes  . Smokeless tobacco: Never Used  . Alcohol Use: 3.0 oz/week    5 Shots of liquor per week     Comment: occasional     Review of Systems  Constitutional: Negative for fever and chills.  Respiratory: Negative for shortness of breath.   Cardiovascular: Negative for chest pain.  Genitourinary: Negative for dysuria and difficulty urinating.  Musculoskeletal: Positive for arthralgias (right shoulder pain). Negative for joint swelling.  Skin: Negative for color change and wound.  Neurological: Negative for dizziness, weakness, numbness and headaches.  All other systems reviewed and are negative.     Allergies  Review of patient's allergies indicates no known allergies.  Home Medications   Prior to Admission medications   Medication Sig  Start Date End Date Taking? Authorizing Provider  acetaminophen (TYLENOL) 500 MG tablet Take 1,000 mg by mouth every 6 (six) hours as needed.   Yes Historical Provider, MD  ibuprofen (ADVIL,MOTRIN) 200 MG tablet Take 200 mg by mouth every 6 (six) hours as needed.   Yes Historical Provider, MD  naproxen sodium (ANAPROX) 220 MG tablet Take 440 mg by mouth 2 (two) times daily as needed.   Yes Historical Provider, MD  lisinopril (PRINIVIL,ZESTRIL) 10 MG tablet Take 1 tablet (10 mg total) by mouth daily. Patient not taking: Reported on 02/09/2015 09/08/14   Raeford Razor, MD  oxyCODONE-acetaminophen (PERCOCET/ROXICET) 5-325 MG per tablet Take 1 tablet by mouth every 4 (four) hours as needed for severe pain. Patient not taking: Reported on 09/08/2014 07/21/13   Burgess Amor, PA-C   BP 164/98 mmHg  Pulse 77  Temp(Src) 97.7 F (36.5 C) (Oral)  Resp 18  Ht  (1.702 m)  Wt 145 lb (65.772 kg)  BMI 22.71 kg/m2  SpO2 100% Physical Exam  Constitutional: He is oriented to person, place, and time. He appears well-developed and well-nourished. No distress.  HENT:  Head: Normocephalic and atraumatic.  Neck: Normal range of motion. Neck supple. No thyromegaly present.  Cardiovascular: Normal rate, regular rhythm, normal heart sounds and intact distal pulses.   No murmur heard. Pulmonary/Chest: Effort normal and breath sounds normal. No respiratory distress. He exhibits no tenderness.  Musculoskeletal: He exhibits tenderness. He exhibits no edema.  ttp of the anterior right shoulder extending to the right deltoid.  Pain  with abduction of the right arm and rotation of the shoulder.  Radial pulse is brisk, distal sensation intact, CR< 2 sec. Grip strength is strong and symmetrical.   No abrasions, edema , erythema or step-off deformity of the joint.   Lymphadenopathy:    He has no cervical adenopathy.  Neurological: He is alert and oriented to person, place, and time. He has normal strength. No sensory deficit.  He exhibits normal muscle tone. Coordination normal.  Skin: Skin is warm and dry.  Nursing note and vitals reviewed.   ED Course  Procedures (including critical care time) Labs Review Labs Reviewed - No data to display  Imaging Review Dg Shoulder Right  02/09/2015   CLINICAL DATA:  Acute right shoulder pain after lifting injury 1 day ago. Initial encounter.  EXAM: RIGHT SHOULDER - 2+ VIEW  COMPARISON:  None.  FINDINGS: There is no evidence of fracture or dislocation. There is no evidence of arthropathy or other focal bone abnormality. Soft tissues are unremarkable.  IMPRESSION: Normal right shoulder.   Electronically Signed   By: Lupita Raider, M.D.   On: 02/09/2015 12:41   I have personally reviewed and evaluated these images and lab results as part of my medical decision-making.   EKG Interpretation None      MDM   Final diagnoses:  Shoulder sprain, right, initial encounter    Pt is well appearing.  Vitals stable.  Pain reproduced with abduction of the right arm.  No focal neuro deficits. XR re-assuring, NV intact, but pain possibly related to rotator cuff injury.  Pt agrees to symptomatic tx and close orthopedic f/u.  Appears stable for d/c    Pauline Aus, PA-C 02/10/15 1735  Bethann Berkshire, MD 02/12/15 [phone]

## 2015-06-02 ENCOUNTER — Emergency Department (HOSPITAL_COMMUNITY)
Admission: EM | Admit: 2015-06-02 | Discharge: 2015-06-02 | Disposition: A | Discharge status: Home / Self Care | Payer: Self-pay | Attending: Emergency Medicine | Admitting: Emergency Medicine

## 2015-06-02 ENCOUNTER — Emergency Department (HOSPITAL_COMMUNITY): Payer: Self-pay

## 2015-06-02 ENCOUNTER — Encounter (HOSPITAL_COMMUNITY): Payer: Self-pay | Admitting: *Deleted

## 2015-06-02 DIAGNOSIS — R300 Dysuria: Secondary | ICD-10-CM | POA: Insufficient documentation

## 2015-06-02 DIAGNOSIS — M545 Low back pain: Secondary | ICD-10-CM | POA: Insufficient documentation

## 2015-06-02 DIAGNOSIS — R109 Unspecified abdominal pain: Secondary | ICD-10-CM | POA: Insufficient documentation

## 2015-06-02 DIAGNOSIS — R110 Nausea: Secondary | ICD-10-CM | POA: Insufficient documentation

## 2015-06-02 DIAGNOSIS — F17210 Nicotine dependence, cigarettes, uncomplicated: Secondary | ICD-10-CM | POA: Insufficient documentation

## 2015-06-02 DIAGNOSIS — Z87442 Personal history of urinary calculi: Secondary | ICD-10-CM | POA: Insufficient documentation

## 2015-06-02 LAB — BASIC METABOLIC PANEL
Anion gap: 11 (ref 5–15)
BUN: 17 mg/dL (ref 6–20)
CALCIUM: 10 mg/dL (ref 8.9–10.3)
CO2: 25 mmol/L (ref 22–32)
CREATININE: 1.14 mg/dL (ref 0.61–1.24)
Chloride: 102 mmol/L (ref 101–111)
GFR calc non Af Amer: >60 mL/min (ref >60)
GLUCOSE: 122 mg/dL — AB (ref 65–99)
Potassium: 3.5 mmol/L (ref 3.5–5.1)
Sodium: 138 mmol/L (ref 135–145)

## 2015-06-02 LAB — CBC WITH DIFFERENTIAL/PLATELET
BASOS PCT: 0 %
Basophils Absolute: 0 10*3/uL (ref 0.0–0.1)
EOS ABS: 0.1 10*3/uL (ref 0.0–0.7)
Eosinophils Relative: 1 %
HCT: 50.7 % (ref 39.0–52.0)
Hemoglobin: 18 g/dL — ABNORMAL HIGH (ref 13.0–17.0)
Lymphocytes Relative: 25 %
Lymphs Abs: 2.2 10*3/uL (ref 0.7–4.0)
MCH: 31.3 pg (ref 26.0–34.0)
MCHC: 35.5 g/dL (ref 30.0–36.0)
MCV: 88 fL (ref 78.0–100.0)
MONO ABS: 0.5 10*3/uL (ref 0.1–1.0)
MONOS PCT: 6 %
Neutro Abs: 6.3 10*3/uL (ref 1.7–7.7)
Neutrophils Relative %: 68 %
PLATELETS: 276 10*3/uL (ref 150–400)
RBC: 5.76 MIL/uL (ref 4.22–5.81)
RDW: 12.2 % (ref 11.5–15.5)
WBC: 9.1 10*3/uL (ref 4.0–10.5)

## 2015-06-02 LAB — URINALYSIS, ROUTINE W REFLEX MICROSCOPIC
BILIRUBIN URINE: NEGATIVE
Glucose, UA: NEGATIVE mg/dL
KETONES UR: NEGATIVE mg/dL
Leukocytes, UA: NEGATIVE
NITRITE: NEGATIVE
Specific Gravity, Urine: 1.02 (ref 1.005–1.030)
pH: 6 (ref 5.0–8.0)

## 2015-06-02 LAB — URINE MICROSCOPIC-ADD ON
BACTERIA UA: NONE SEEN
SQUAMOUS EPITHELIAL / LPF: NONE SEEN
WBC, UA: NONE SEEN WBC/hpf (ref 0–5)

## 2015-06-02 MED ORDER — HYDROMORPHONE HCL 1 MG/ML IJ SOLN
1.0000 mg | Freq: Once | INTRAMUSCULAR | Status: AC
  Administered 2015-06-02: 1 mg via INTRAVENOUS
  Filled 2015-06-02: qty 1

## 2015-06-02 MED ORDER — KETOROLAC TROMETHAMINE 30 MG/ML IJ SOLN
30.0000 mg | Freq: Once | INTRAMUSCULAR | Status: AC
  Administered 2015-06-02: 30 mg via INTRAVENOUS
  Filled 2015-06-02: qty 1

## 2015-06-02 MED ORDER — ONDANSETRON HCL 4 MG PO TABS: 4.0000 mg | ORAL_TABLET | Freq: Four times a day (QID) | ORAL | Status: DC

## 2015-06-02 MED ORDER — OXYCODONE-ACETAMINOPHEN 5-325 MG PO TABS: 1.0000 | ORAL_TABLET | ORAL | Status: DC | PRN

## 2015-06-02 MED ORDER — ONDANSETRON HCL 4 MG/2ML IJ SOLN
4.0000 mg | Freq: Once | INTRAMUSCULAR | Status: AC
  Administered 2015-06-02: 4 mg via INTRAVENOUS
  Filled 2015-06-02: qty 2

## 2015-06-02 NOTE — ED Notes (Signed)
Brett Liu at bedside. 

## 2015-06-02 NOTE — Discharge Instructions (Signed)
Flank Pain °Flank pain is pain in your side. The flank is the area of your side between your upper belly (abdomen) and your back. Pain in this area can be caused by many different things. °HOME CARE °Home care and treatment will depend on the cause of your pain. °· Rest as told by your doctor. °· Drink enough fluids to keep your pee (urine) clear or pale yellow.   °· Only take medicine as told by your doctor. °· Tell your doctor about any changes in your pain. °· Follow up with your doctor. °GET HELP RIGHT AWAY IF:  °· Your pain does not get better with medicine.   °· You have new symptoms or your symptoms get worse. °· Your pain gets worse.   °· You have belly (abdominal) pain.   °· You are short of breath.   °· You always feel sick to your stomach (nauseous).   °· You keep throwing up (vomiting).   °· You have puffiness (swelling) in your belly.   °· You feel light-headed or you pass out (faint).   °· You have blood in your pee. °· You have a fever or lasting symptoms for more than 2-3 days. °· You have a fever and your symptoms suddenly get worse. °MAKE SURE YOU:  °· Understand these instructions. °· Will watch your condition. °· Will get help right away if you are not doing well or get worse. °  °This information is not intended to replace advice given to you by your health care provider. Make sure you discuss any questions you have with your health care provider. °  °Document Released: 02/15/2008 Document Revised: 05/29/2014 Document Reviewed: 12/21/2011 °Elsevier Interactive Patient Education ©2016 [employer] ° °

## 2015-06-02 NOTE — ED Provider Notes (Signed)
CSN: [id number]     Arrival date & time 06/02/15  4098 History   First MD Initiated Contact with Patient 06/02/15 0831     Chief Complaint  Patient presents with  . Flank Pain     (Consider location/radiation/quality/duration/timing/severity/associated sxs/prior Treatment) HPI   Dragan Tamburrino is a 25 y.o. male who presents to the Emergency Department complaining of gradual onset of bilateral flank pain for three days.  He states pain feels similar to previous kidney stone pain.  Pain is greater on the right and worse with movement.  He describes the pain as sharp and intermittently radiates into the right groin.  Pain is associated with nausea.  Last kidney stone was 6 months ago and spontaneously passed.  He denies hematuria, vomiting, fever, chills, and chest pain.  He has taken tylenol without pain relief.   Past Medical History  Diagnosis Date  . Kidney stone    History reviewed. No pertinent past surgical history. No family history on file. Social History  Substance Use Topics  . Smoking status: Current Every Day Smoker -- 0.50 packs/day    Types: Cigarettes  . Smokeless tobacco: Never Used  . Alcohol Use: 3.0 oz/week    5 Shots of liquor per week     Comment: occasional     Review of Systems  Constitutional: Negative for fever and chills.  Respiratory: Negative for shortness of breath.   Cardiovascular: Negative for chest pain.  Gastrointestinal: Positive for nausea. Negative for vomiting, abdominal pain and constipation.  Genitourinary: Positive for dysuria and flank pain. Negative for hematuria, decreased urine volume, scrotal swelling, difficulty urinating, penile pain and testicular pain.  Musculoskeletal: Positive for back pain. Negative for joint swelling.  Skin: Negative for rash.  Neurological: Negative for weakness and numbness.  All other systems reviewed and are negative.     Allergies  Review of patient's allergies indicates no known  allergies.  Home Medications   Prior to Admission medications   Medication Sig Start Date End Date Taking? Authorizing Provider  acetaminophen (TYLENOL) 500 MG tablet Take 1,000 mg by mouth every 6 (six) hours as needed.    Historical Provider, MD  HYDROcodone-acetaminophen (NORCO/VICODIN) 5-325 MG per tablet Take one-two tabs po q 4-6 hrs prn pain 02/09/15   [place] Dalporto, PA-C  ibuprofen (ADVIL,MOTRIN) 200 MG tablet Take 200 mg by mouth every 6 (six) hours as needed.    Historical Provider, MD  naproxen sodium (ANAPROX) 220 MG tablet Take 440 mg by mouth 2 (two) times daily as needed.    Historical Provider, MD   BP 188/106 mmHg  Pulse 113  Temp(Src) 98.3 F (36.8 C) (Oral)  Resp 16  Ht 5\' 6"  (1.676 m)  Wt 71.215 kg  BMI 25.35 kg/m2  SpO2 100% Physical Exam  Constitutional: He is oriented to person, place, and time. He appears well-developed and well-nourished. No distress.  HENT:  Head: Normocephalic and atraumatic.  Neck: Normal range of motion.  Cardiovascular: Normal rate, regular rhythm and normal heart sounds.   Pulmonary/Chest: Effort normal and breath sounds normal. No respiratory distress.  Abdominal: He exhibits no distension. There is no rebound and no guarding.  Mild CVA tenderness bilaterally, right > left. Remaining abdomen is soft, NT  Musculoskeletal: Normal range of motion.  Neurological: He is alert and oriented to person, place, and time.  Skin: Skin is warm.  Psychiatric: He has a normal mood and affect.    ED Course  Procedures (including critical care time) Labs Review  Labs Reviewed  BASIC METABOLIC PANEL - Abnormal; Notable for the following:    Glucose, Bld 122 (*)    All other components within normal limits  CBC WITH DIFFERENTIAL/PLATELET - Abnormal; Notable for the following:    Hemoglobin 18.0 (*)    All other components within normal limits  URINALYSIS, ROUTINE W REFLEX MICROSCOPIC (NOT [facility]) - Abnormal; Notable for the following:    Hgb  urine dipstick SMALL (*)    Protein, ur TRACE (*)    All other components within normal limits  URINE CULTURE  URINE MICROSCOPIC-ADD ON    Imaging Review Ct Renal Stone Study  06/02/2015  CLINICAL DATA:  Right kidney pain since 05/30/2015. Denies hematuria. EXAM: CT ABDOMEN AND PELVIS WITHOUT CONTRAST TECHNIQUE: Multidetector CT imaging of the abdomen and pelvis was performed following the standard protocol without IV contrast. COMPARISON:  07/25/2013, 07/21/2013, 12/29/2011 FINDINGS: Lower chest:  Clear lung bases.  Normal heart size. Hepatobiliary: Normal liver.  Normal gallbladder. Pancreas: Normal. Spleen: Normal. Adrenals/Urinary Tract: Normal adrenal glands. Punctate nonobstructing left renal calculus in the inferior pole. 10 mm right renal calculus. Three hypodense, fluid attenuating right inferior pole renal masses with the mass is slightly enlarged compared with 07/25/2013. Decompressed bladder. No ureteral calculus. Stomach/Bowel: No bowel wall thickening or dilatation. No pneumatosis, pneumoperitoneum or portal venous gas. Vascular/Lymphatic: Normal caliber abdominal aorta. No lymphadenopathy. Other: No fluid collection or hematoma. Musculoskeletal: No lytic or sclerotic osseous lesion. No acute osseous abnormality. IMPRESSION: 1. 10 mm nonobstructing right renal calculus. Punctate nonobstructing left renal calculus. No ureterolithiasis or bladder calculus. 2. Three hypodense, fluid attenuating right inferior pole renal masses with the mass is slightly enlarged compared with 07/25/2013. These may reflect renal cysts versus calyceal diverticula. Electronically Signed   By: Elige KoHetal  Patel   On: 06/02/2015 11:05   I have personally reviewed and evaluated these images and lab results as part of my medical decision-making.    MDM   Final diagnoses:  Flank pain    Pt with bilateral flank pain and hx of kidney stones, will obtain labs and CT stone study.    CT results and labs reviewed and  discussed with pt and with Dr. Estell HarpinZammit.  Pt appears stable for d/c, feeling better, agrees to arrange urology f/u , referral info given for Dr. Sherron MondayMacDiarmid.    RX for percocet, zofran.   Pauline Ausammy Areebah Meinders, PA-C 06/03/15 [id number]  Bethann BerkshireJoseph Zammit, MD 06/04/15 [phone]

## 2015-06-02 NOTE — ED Notes (Signed)
Pt comes in with bilateral flank pain more on the right side. Pain started Sunday after noon. Pt has hx of kidney stones and states this feels the same. Pt denies any emesis only has nausea.

## 2015-06-04 LAB — URINE CULTURE: Culture: NO GROWTH

## 2015-06-07 ENCOUNTER — Emergency Department (HOSPITAL_COMMUNITY)
Admission: EM | Admit: 2015-06-07 | Discharge: 2015-06-07 | Disposition: A | Discharge status: Home / Self Care | Payer: Self-pay | Attending: Emergency Medicine | Admitting: Emergency Medicine

## 2015-06-07 ENCOUNTER — Encounter (HOSPITAL_COMMUNITY): Payer: Self-pay | Admitting: *Deleted

## 2015-06-07 ENCOUNTER — Emergency Department (HOSPITAL_COMMUNITY): Payer: Self-pay

## 2015-06-07 DIAGNOSIS — F17210 Nicotine dependence, cigarettes, uncomplicated: Secondary | ICD-10-CM | POA: Insufficient documentation

## 2015-06-07 DIAGNOSIS — N201 Calculus of ureter: Secondary | ICD-10-CM | POA: Insufficient documentation

## 2015-06-07 LAB — URINALYSIS, ROUTINE W REFLEX MICROSCOPIC
Bilirubin Urine: NEGATIVE
Glucose, UA: NEGATIVE mg/dL
Ketones, ur: NEGATIVE mg/dL
LEUKOCYTES UA: NEGATIVE
Nitrite: NEGATIVE
PROTEIN: NEGATIVE mg/dL
SPECIFIC GRAVITY, URINE: 1.02 (ref 1.005–1.030)
pH: 8 (ref 5.0–8.0)

## 2015-06-07 LAB — COMPREHENSIVE METABOLIC PANEL
ALT: 14 U/L — ABNORMAL LOW (ref 17–63)
ANION GAP: 7 (ref 5–15)
AST: 22 U/L (ref 15–41)
Albumin: 4.5 g/dL (ref 3.5–5.0)
Alkaline Phosphatase: 43 U/L (ref 38–126)
BUN: 16 mg/dL (ref 6–20)
CALCIUM: 9.4 mg/dL (ref 8.9–10.3)
CHLORIDE: 103 mmol/L (ref 101–111)
CO2: 28 mmol/L (ref 22–32)
Creatinine, Ser: 1.18 mg/dL (ref 0.61–1.24)
Glucose, Bld: 99 mg/dL (ref 65–99)
Potassium: 3.9 mmol/L (ref 3.5–5.1)
SODIUM: 138 mmol/L (ref 135–145)
Total Bilirubin: 0.7 mg/dL (ref 0.3–1.2)
Total Protein: 7.5 g/dL (ref 6.5–8.1)

## 2015-06-07 LAB — CBC
HCT: 44.2 % (ref 39.0–52.0)
HEMOGLOBIN: 15 g/dL (ref 13.0–17.0)
MCH: 29.8 pg (ref 26.0–34.0)
MCHC: 33.9 g/dL (ref 30.0–36.0)
MCV: 87.9 fL (ref 78.0–100.0)
PLATELETS: 232 10*3/uL (ref 150–400)
RBC: 5.03 MIL/uL (ref 4.22–5.81)
RDW: 12.1 % (ref 11.5–15.5)
WBC: 7.5 10*3/uL (ref 4.0–10.5)

## 2015-06-07 LAB — URINE MICROSCOPIC-ADD ON

## 2015-06-07 MED ORDER — METOCLOPRAMIDE HCL 10 MG PO TABS: 10.0000 mg | ORAL_TABLET | Freq: Four times a day (QID) | ORAL | Status: DC | PRN

## 2015-06-07 MED ORDER — OXYCODONE-ACETAMINOPHEN 5-325 MG PO TABS
1.0000 | ORAL_TABLET | Freq: Once | ORAL | Status: AC
  Administered 2015-06-07: 1 via ORAL
  Filled 2015-06-07: qty 1

## 2015-06-07 MED ORDER — TAMSULOSIN HCL 0.4 MG PO CAPS: 0.4000 mg | ORAL_CAPSULE | Freq: Every day | ORAL | Status: DC

## 2015-06-07 MED ORDER — OXYCODONE-ACETAMINOPHEN 5-325 MG PO TABS: 1.0000 | ORAL_TABLET | ORAL | Status: DC | PRN

## 2015-06-07 NOTE — ED Provider Notes (Signed)
CSN: [id number]     Arrival date & time 06/07/15  1227 History   First MD Initiated Contact with Patient 06/07/15 1451     Chief Complaint  Patient presents with  . Flank Pain     (Consider location/radiation/quality/duration/timing/severity/associated sxs/prior Treatment) Patient is a 25 y.o. male presenting with flank pain. The history is provided by the patient.  Flank Pain  He complains of right flank pain which has been present for several weeks. He had been in the emergency department 5 days ago and had CT scan which showed a 10 mm right renal calculus and he was referred to urology. He had been given a prescription for oxycodone-acetaminophen which had been giving him relief of pain but he has run out. He has an appointment with his urologist in 3 days. He rates pain at 8/10. It is worse when he bends over and lifts. His job does involve a fair amount of this type of movement. He denies any radiation of pain to his hips or legs. There is no associated nausea or vomiting. There is no weakness, numbness, tingling. He does have history of kidney stones in the past and states that this pain is similar to what he has had before.  Past Medical History  Diagnosis Date  . Kidney stone    History reviewed. No pertinent past surgical history. No family history on file. Social History  Substance Use Topics  . Smoking status: Current Every Day Smoker -- 0.50 packs/day    Types: Cigarettes  . Smokeless tobacco: Never Used  . Alcohol Use: 3.0 oz/week    5 Shots of liquor per week     Comment: occasional     Review of Systems  Genitourinary: Positive for flank pain.  All other systems reviewed and are negative.     Allergies  Review of patient's allergies indicates no known allergies.  Home Medications   Prior to Admission medications   Medication Sig Start Date End Date Taking? Authorizing Provider  acetaminophen (TYLENOL) 325 MG tablet Take 650 mg by mouth every 4 (four) hours  as needed (pain).   Yes Historical Provider, MD  naproxen sodium (ANAPROX) 220 MG tablet Take 440 mg by mouth 2 (two) times daily as needed (pain).    Yes Historical Provider, MD  ondansetron (ZOFRAN) 4 MG tablet Take 1 tablet (4 mg total) by mouth every 6 (six) hours. 06/02/15  Yes Tammy Triplett, PA-C  HYDROcodone-acetaminophen (NORCO/VICODIN) 5-325 MG per tablet Take one-two tabs po q 4-6 hrs prn pain 02/09/15   Tammy Triplett, PA-C  oxyCODONE-acetaminophen (PERCOCET/ROXICET) 5-325 MG tablet Take 1 tablet by mouth every 4 (four) hours as needed. 06/02/15   Tammy Triplett, PA-C   BP 158/90 mmHg  Pulse 86  Temp(Src) 98.8 F (37.1 C) (Oral)  Resp 18  Ht 5\' 6"  (1.676 m)  Wt 155 lb (70.308 kg)  BMI 25.03 kg/m2  SpO2 100% Physical Exam  Nursing note and vitals reviewed.  25 year old male, resting comfortably and in no acute distress. Vital signs are significant for hypertension. Oxygen saturation is 100%, which is normal. Head is normocephalic and atraumatic. PERRLA, EOMI. Oropharynx is clear. Neck is nontender and supple without adenopathy or JVD. Back has mild to moderate right paralumbar and CVA tenderness with moderate paralumbar spasm. There is no midline tenderness. Straight leg raise is negative. Lungs are clear without rales, wheezes, or rhonchi. Chest is nontender. Heart has regular rate and rhythm without murmur. Abdomen is soft, flat, nontender without  masses or hepatosplenomegaly and peristalsis is normoactive. Extremities have no cyanosis or edema, full range of motion is present. Skin is warm and dry without rash. Neurologic: Mental status is normal, cranial nerves are intact, there are no motor or sensory deficits.  ED Course  Procedures (including critical care time) Labs Review Results for orders placed or performed during the hospital encounter of 06/07/15  Comprehensive metabolic panel  Result Value Ref Range   Sodium 138 135 - 145 mmol/L   Potassium 3.9 3.5 - 5.1  mmol/L   Chloride 103 101 - 111 mmol/L   CO2 28 22 - 32 mmol/L   Glucose, Bld 99 65 - 99 mg/dL   BUN 16 6 - 20 mg/dL   Creatinine, Ser 1.61 0.61 - 1.24 mg/dL   Calcium 9.4 8.9 - 09.6 mg/dL   Total Protein 7.5 6.5 - 8.1 g/dL   Albumin 4.5 3.5 - 5.0 g/dL   AST 22 15 - 41 U/L   ALT 14 (L) 17 - 63 U/L   Alkaline Phosphatase 43 38 - 126 U/L   Total Bilirubin 0.7 0.3 - 1.2 mg/dL   GFR calc non Af Amer >60 >60 mL/min   GFR calc Af Amer >60 >60 mL/min   Anion gap 7 5 - 15  CBC  Result Value Ref Range   WBC 7.5 4.0 - 10.5 K/uL   RBC 5.03 4.22 - 5.81 MIL/uL   Hemoglobin 15.0 13.0 - 17.0 g/dL   HCT 04.5 40.9 - 81.1 %   MCV 87.9 78.0 - 100.0 fL   MCH 29.8 26.0 - 34.0 pg   MCHC 33.9 30.0 - 36.0 g/dL   RDW 91.4 78.2 - 95.6 %   Platelets 232 150 - 400 K/uL   Imaging Review US Renal  06/07/2015  CLINICAL DATA:  Right flank pain.  Known renal stones. EXAM: RENAL / URINARY TRACT ULTRASOUND COMPLETE COMPARISON:  CT scan June 02, 2015 FINDINGS: Right Kidney: Length: 11.9 cm. The patient's known renal stone is located near the UPJ, measuring 14 x 13 mm. There is mild prominence of a few calices, not seen on the recent CT scan. Three small cysts are seen in the right kidney. The cystic structure in the mid right kidney contains a single septation measuring 17 x 16 x 20 mm. Left Kidney: Length: 11.8. Possible 5 mm stone in the left kidney, not seen on the recent CT scan. It is possible this is artifactual. No hydronephrosis or mass. Bladder: There is debris within the bladder with no focal masses. Ureteral jets were not identified. IMPRESSION: 1. The 14 x 13 mm stone seen in the right kidney on the recent CT scan is now located near the UPJ with mild prominence of the calices, not seen on the previous study. 2. Right renal cysts, 1 of which contains a single septation. Electronically Signed   By: Gerome Sam III M.D   On: 06/07/2015 16:13   I have personally reviewed and evaluated these images and  lab results as part of my medical decision-making.  MDM   Final diagnoses:  Ureterolithiasis    Right flank pain which I suspect is more musculoskeletal. Reviewed his past records and his CT scan from 5 days ago which shows a right renal calculus but no hydronephrosis. Urinalysis on that day did not have significant hematuria either. I discussed these findings with the patient. He will be sent for renal ultrasound to make sure that he does not have hydronephrosis today. If not  present, will treat for musculoskeletal back pain.  Ultrasound seems to demonstrate calculus is moved into the ureter. I'm still suspicious that the pain I'm seeing today is more musculoskeletal than from renal colic. He has an appointment with urology in 3 days and is to keep that appointment. He is discharged with prescriptions for tamsulosin, metoclopramide for nausea, and oxycodone-acetaminophen.  Dione Boozeavid Loreena Valeri, MD 06/07/15 [phone]

## 2015-06-07 NOTE — Discharge Instructions (Signed)
Keep your appointment with the urologist.   Kidney Stones Kidney stones (urolithiasis) are deposits that form inside your kidneys. The intense pain is caused by the stone moving through the urinary tract. When the stone moves, the ureter goes into spasm around the stone. The stone is usually passed in the urine.  CAUSES   A disorder that makes certain neck glands produce too much parathyroid hormone (primary hyperparathyroidism).  A buildup of uric acid crystals, similar to gout in your joints.  Narrowing (stricture) of the ureter.  A kidney obstruction present at birth (congenital obstruction).  Previous surgery on the kidney or ureters.  Numerous kidney infections. SYMPTOMS   Feeling sick to your stomach (nauseous).  Throwing up (vomiting).  Blood in the urine (hematuria).  Pain that usually spreads (radiates) to the groin.  Frequency or urgency of urination. DIAGNOSIS   Taking a history and physical exam.  Blood or urine tests.  CT scan.  Occasionally, an examination of the inside of the urinary bladder (cystoscopy) is performed. TREATMENT   Observation.  Increasing your fluid intake.  Extracorporeal shock wave lithotripsy--This is a noninvasive procedure that uses shock waves to break up kidney stones.  Surgery may be needed if you have severe pain or persistent obstruction. There are various surgical procedures. Most of the procedures are performed with the use of small instruments. Only small incisions are needed to accommodate these instruments, so recovery time is minimized. The size, location, and chemical composition are all important variables that will determine the proper choice of action for you. Talk to your health care provider to better understand your situation so that you will minimize the risk of injury to yourself and your kidney.  HOME CARE INSTRUCTIONS   Drink enough water and fluids to keep your urine clear or pale yellow. This will help you  to pass the stone or stone fragments.  Strain all urine through the provided strainer. Keep all particulate matter and stones for your health care provider to see. The stone causing the pain may be as small as a grain of salt. It is very important to use the strainer each and every time you pass your urine. The collection of your stone will allow your health care provider to analyze it and verify that a stone has actually passed. The stone analysis will often identify what you can do to reduce the incidence of recurrences.  Only take over-the-counter or prescription medicines for pain, discomfort, or fever as directed by your health care provider.  Keep all follow-up visits as told by your health care provider. This is important.  Get follow-up X-rays if required. The absence of pain does not always mean that the stone has passed. It may have only stopped moving. If the urine remains completely obstructed, it can cause loss of kidney function or even complete destruction of the kidney. It is your responsibility to make sure X-rays and follow-ups are completed. Ultrasounds of the kidney can show blockages and the status of the kidney. Ultrasounds are not associated with any radiation and can be performed easily in a matter of minutes.  Make changes to your daily diet as told by your health care provider. You may be told to:  Limit the amount of salt that you eat.  Eat 5 or more servings of fruits and vegetables each day.  Limit the amount of meat, poultry, fish, and eggs that you eat.  Collect a 24-hour urine sample as told by your health care provider.You may  need to collect another urine sample every 6-12 months. SEEK MEDICAL CARE IF:  You experience pain that is progressive and unresponsive to any pain medicine you have been prescribed. SEEK IMMEDIATE MEDICAL CARE IF:   Pain cannot be controlled with the prescribed medicine.  You have a fever or shaking chills.  The severity or  intensity of pain increases over 18 hours and is not relieved by pain medicine.  You develop a new onset of abdominal pain.  You feel faint or pass out.  You are unable to urinate.   This information is not intended to replace advice given to you by your health care provider. Make sure you discuss any questions you have with your health care provider.   Document Released: 05/08/2005 Document Revised: 01/27/2015 Document Reviewed: 10/09/2012 Elsevier Interactive Patient Education 2016 [employer]  Tamsulosin capsules What is this medicine? TAMSULOSIN (tam SOO loe sin) is used to treat enlargement of the prostate gland in men, a condition called benign prostatic hyperplasia or BPH. It is not for use in women. It works by relaxing muscles in the prostate and bladder neck. This improves urine flow and reduces BPH symptoms. This medicine may be used for other purposes; ask your health care provider or pharmacist if you have questions. What should I tell my health care provider before I take this medicine? They need to know if you have any of the following conditions: -advanced kidney disease -advanced liver disease -low blood pressure -prostate cancer -an unusual or allergic reaction to tamsulosin, sulfa drugs, other medicines, foods, dyes, or preservatives -pregnant or trying to get pregnant -breast-feeding How should I use this medicine? Take this medicine by mouth about 30 minutes after the same meal every day. Follow the directions on the prescription label. Swallow the capsules whole with a glass of water. Do not crush, chew, or open capsules. Do not take your medicine more often than directed. Do not stop taking your medicine unless your doctor tells you to. Talk to your pediatrician regarding the use of this medicine in children. Special care may be needed. Overdosage: If you think you have taken too much of this medicine contact a poison control center or emergency room at  once. NOTE: This medicine is only for you. Do not share this medicine with others. What if I miss a dose? If you miss a dose, take it as soon as you can. If it is almost time for your next dose, take only that dose. Do not take double or extra doses. If you stop taking your medicine for several days or more, ask your doctor or health care professional what dose you should start back on. What may interact with this medicine? -cimetidine -fluoxetine -ketoconazole -medicines for erectile disfunction like sildenafil, tadalafil, vardenafil -medicines for high blood pressure -other alpha-blockers like alfuzosin, doxazosin, phentolamine, phenoxybenzamine, prazosin, terazosin -warfarin This list may not describe all possible interactions. Give your health care provider a list of all the medicines, herbs, non-prescription drugs, or dietary supplements you use. Also tell them if you smoke, drink alcohol, or use illegal drugs. Some items may interact with your medicine. What should I watch for while using this medicine? Visit your doctor or health care professional for regular check ups. You will need lab work done before you start this medicine and regularly while you are taking it. Check your blood pressure as directed. Ask your health care professional what your blood pressure should be, and when you should contact him or her. This  medicine may make you feel dizzy or lightheaded. This is more likely to happen after the first dose, after an increase in dose, or during hot weather or exercise. Drinking alcohol and taking some medicines can make this worse. Do not drive, use machinery, or do anything that needs mental alertness until you know how this medicine affects you. Do not sit or stand up quickly. If you begin to feel dizzy, sit down until you feel better. These effects can decrease once your body adjusts to the medicine. Contact your doctor or health care professional right away if you have an erection  that lasts longer than 4 hours or if it becomes painful. This may be a sign of a serious problem and must be treated right away to prevent permanent damage. If you are thinking of having cataract surgery, tell your eye surgeon that you have taken this medicine. What side effects may I notice from receiving this medicine? Side effects that you should report to your doctor or health care professional as soon as possible: -allergic reactions like skin rash or itching, hives, swelling of the lips, mouth, tongue, or throat -breathing problems -change in vision -feeling faint or lightheaded -irregular heartbeat -prolonged or painful erection -weakness Side effects that usually do not require medical attention (report to your doctor or health care professional if they continue or are bothersome): -back pain -change in sex drive or performance -constipation, nausea or vomiting -cough -drowsy -runny or stuffy nose -trouble sleeping This list may not describe all possible side effects. Call your doctor for medical advice about side effects. You may report side effects to FDA at 1-800-FDA-1088. Where should I keep my medicine? Keep out of the reach of children. Store at room temperature between 15 and 30 degrees C (59 and 86 degrees F). Throw away any unused medicine after the expiration date. NOTE: This sheet is a summary. It may not cover all possible information. If you have questions about this medicine, talk to your doctor, pharmacist, or health care provider.    2016, Elsevier/Gold Standard. (2012-05-08 14:11:34)  Acetaminophen; Oxycodone tablets What is this medicine? ACETAMINOPHEN; OXYCODONE (a set a MEE noe fen; ox i KOE done) is a pain reliever. It is used to treat moderate to severe pain. This medicine may be used for other purposes; ask your health care provider or pharmacist if you have questions. What should I tell my health care provider before I take this medicine? They need to  know if you have any of these conditions: -brain tumor -Crohn's disease, inflammatory bowel disease, or ulcerative colitis -drug abuse or addiction -head injury -heart or circulation problems -if you often drink alcohol -kidney disease or problems going to the bathroom -liver disease -lung disease, asthma, or breathing problems -an unusual or allergic reaction to acetaminophen, oxycodone, other opioid analgesics, other medicines, foods, dyes, or preservatives -pregnant or trying to get pregnant -breast-feeding How should I use this medicine? Take this medicine by mouth with a full glass of water. Follow the directions on the prescription label. You can take it with or without food. If it upsets your stomach, take it with food. Take your medicine at regular intervals. Do not take it more often than directed. Talk to your pediatrician regarding the use of this medicine in children. Special care may be needed. Patients over 26 years old may have a stronger reaction and need a smaller dose. Overdosage: If you think you have taken too much of this medicine contact a poison control  center or emergency room at once. NOTE: This medicine is only for you. Do not share this medicine with others. What if I miss a dose? If you miss a dose, take it as soon as you can. If it is almost time for your next dose, take only that dose. Do not take double or extra doses. What may interact with this medicine? -alcohol -antihistamines -barbiturates like amobarbital, butalbital, butabarbital, methohexital, pentobarbital, phenobarbital, thiopental, and secobarbital -benztropine -drugs for bladder problems like solifenacin, trospium, oxybutynin, tolterodine, hyoscyamine, and methscopolamine -drugs for breathing problems like ipratropium and tiotropium -drugs for certain stomach or intestine problems like propantheline, homatropine methylbromide, glycopyrrolate, atropine, belladonna, and dicyclomine -general  anesthetics like etomidate, ketamine, nitrous oxide, propofol, desflurane, enflurane, halothane, isoflurane, and sevoflurane -medicines for depression, anxiety, or psychotic disturbances -medicines for sleep -muscle relaxants -naltrexone -narcotic medicines (opiates) for pain -phenothiazines like perphenazine, thioridazine, chlorpromazine, mesoridazine, fluphenazine, prochlorperazine, promazine, and trifluoperazine -scopolamine -tramadol -trihexyphenidyl This list may not describe all possible interactions. Give your health care provider a list of all the medicines, herbs, non-prescription drugs, or dietary supplements you use. Also tell them if you smoke, drink alcohol, or use illegal drugs. Some items may interact with your medicine. What should I watch for while using this medicine? Tell your doctor or health care professional if your pain does not go away, if it gets worse, or if you have new or a different type of pain. You may develop tolerance to the medicine. Tolerance means that you will need a higher dose of the medication for pain relief. Tolerance is normal and is expected if you take this medicine for a long time. Do not suddenly stop taking your medicine because you may develop a severe reaction. Your body becomes used to the medicine. This does NOT mean you are addicted. Addiction is a behavior related to getting and using a drug for a non-medical reason. If you have pain, you have a medical reason to take pain medicine. Your doctor will tell you how much medicine to take. If your doctor wants you to stop the medicine, the dose will be slowly lowered over time to avoid any side effects. You may get drowsy or dizzy. Do not drive, use machinery, or do anything that needs mental alertness until you know how this medicine affects you. Do not stand or sit up quickly, especially if you are an older patient. This reduces the risk of dizzy or fainting spells. Alcohol may interfere with the  effect of this medicine. Avoid alcoholic drinks. There are different types of narcotic medicines (opiates) for pain. If you take more than one type at the same time, you may have more side effects. Give your health care provider a list of all medicines you use. Your doctor will tell you how much medicine to take. Do not take more medicine than directed. Call emergency for help if you have problems breathing. The medicine will cause constipation. Try to have a bowel movement at least every 2 to 3 days. If you do not have a bowel movement for 3 days, call your doctor or health care professional. Do not take Tylenol (acetaminophen) or medicines that have acetaminophen with this medicine. Too much acetaminophen can be very dangerous. Many nonprescription medicines contain acetaminophen. Always read the labels carefully to avoid taking more acetaminophen. What side effects may I notice from receiving this medicine? Side effects that you should report to your doctor or health care professional as soon as possible: -allergic reactions like skin rash,  itching or hives, swelling of the face, lips, or tongue -breathing difficulties, wheezing -confusion -light headedness or fainting spells -severe stomach pain -unusually weak or tired -yellowing of the skin or the whites of the eyes Side effects that usually do not require medical attention (report to your doctor or health care professional if they continue or are bothersome): -dizziness -drowsiness -nausea -vomiting This list may not describe all possible side effects. Call your doctor for medical advice about side effects. You may report side effects to FDA at 1-800-FDA-1088. Where should I keep my medicine? Keep out of the reach of children. This medicine can be abused. Keep your medicine in a safe place to protect it from theft. Do not share this medicine with anyone. Selling or giving away this medicine is dangerous and against the law. This medicine  may cause accidental overdose and death if it taken by other adults, children, or pets. Mix any unused medicine with a substance like cat litter or coffee grounds. Then throw the medicine away in a sealed container like a sealed bag or a coffee can with a lid. Do not use the medicine after the expiration date. Store at room temperature between 20 and 25 degrees C (68 and 77 degrees F). NOTE: This sheet is a summary. It may not cover all possible information. If you have questions about this medicine, talk to your doctor, pharmacist, or health care provider.    2016, Elsevier/Gold Standard. (2014-04-08 15:18:46)  Metoclopramide tablets What is this medicine? METOCLOPRAMIDE (met oh kloe PRA mide) is used to treat the symptoms of gastroesophageal reflux disease (GERD) like heartburn. It is also used to treat people with slow emptying of the stomach and intestinal tract. This medicine may be used for other purposes; ask your health care provider or pharmacist if you have questions. What should I tell my health care provider before I take this medicine? They need to know if you have any of these conditions: -breast cancer -depression -diabetes -heart failure -high blood pressure -kidney disease -liver disease -Parkinson's disease or a movement disorder -pheochromocytoma -seizures -stomach obstruction, bleeding, or perforation -an unusual or allergic reaction to metoclopramide, procainamide, sulfites, other medicines, foods, dyes, or preservatives -pregnant or trying to get pregnant -breast-feeding How should I use this medicine? Take this medicine by mouth with a glass of water. Follow the directions on the prescription label. Take this medicine on an empty stomach, about 30 minutes before eating. Take your doses at regular intervals. Do not take your medicine more often than directed. Do not stop taking except on the advice of your doctor or health care professional. A special MedGuide will  be given to you by the pharmacist with each prescription and refill. Be sure to read this information carefully each time. Talk to your pediatrician regarding the use of this medicine in children. Special care may be needed. Overdosage: If you think you have taken too much of this medicine contact a poison control center or emergency room at once. NOTE: This medicine is only for you. Do not share this medicine with others. What if I miss a dose? If you miss a dose, take it as soon as you can. If it is almost time for your next dose, take only that dose. Do not take double or extra doses. What may interact with this medicine? -acetaminophen -cyclosporine -digoxin -medicines for blood pressure -medicines for diabetes, including insulin -medicines for hay fever and other allergies -medicines for depression, especially an Monoamine Oxidase Inhibitor (MAOI) -  medicines for Parkinson's disease, like levodopa -medicines for sleep or for pain -tetracycline This list may not describe all possible interactions. Give your health care provider a list of all the medicines, herbs, non-prescription drugs, or dietary supplements you use. Also tell them if you smoke, drink alcohol, or use illegal drugs. Some items may interact with your medicine. What should I watch for while using this medicine? It may take a few weeks for your stomach condition to start to get better. However, do not take this medicine for longer than 12 weeks. The longer you take this medicine, and the more you take it, the greater your chances are of developing serious side effects. If you are an elderly patient, a male patient, or you have diabetes, you may be at an increased risk for side effects from this medicine. Contact your doctor immediately if you start having movements you cannot control such as lip smacking, rapid movements of the tongue, involuntary or uncontrollable movements of the eyes, head, arms and legs, or muscle twitches  and spasms. Patients and their families should watch out for worsening depression or thoughts of suicide. Also watch out for any sudden or severe changes in feelings such as feeling anxious, agitated, panicky, irritable, hostile, aggressive, impulsive, severely restless, overly excited and hyperactive, or not being able to sleep. If this happens, especially at the beginning of treatment or after a change in dose, call your doctor. Do not treat yourself for high fever. Ask your doctor or health care professional for advice. You may get drowsy or dizzy. Do not drive, use machinery, or do anything that needs mental alertness until you know how this drug affects you. Do not stand or sit up quickly, especially if you are an older patient. This reduces the risk of dizzy or fainting spells. Alcohol can make you more drowsy and dizzy. Avoid alcoholic drinks. What side effects may I notice from receiving this medicine? Side effects that you should report to your doctor or health care professional as soon as possible: -allergic reactions like skin rash, itching or hives, swelling of the face, lips, or tongue -abnormal production of milk in females -breast enlargement in both males and females -change in the way you walk -difficulty moving, speaking or swallowing -drooling, lip smacking, or rapid movements of the tongue -excessive sweating -fever -involuntary or uncontrollable movements of the eyes, head, arms and legs -irregular heartbeat or palpitations -muscle twitches and spasms -unusually weak or tired Side effects that usually do not require medical attention (report to your doctor or health care professional if they continue or are bothersome): -change in sex drive or performance -depressed mood -diarrhea -difficulty sleeping -headache -menstrual changes -restless or nervous This list may not describe all possible side effects. Call your doctor for medical advice about side effects. You may  report side effects to FDA at 1-800-FDA-1088. Where should I keep my medicine? Keep out of the reach of children. Store at room temperature between 20 and 25 degrees C (68 and 77 degrees F). Protect from light. Keep container tightly closed. Throw away any unused medicine after the expiration date. NOTE: This sheet is a summary. It may not cover all possible information. If you have questions about this medicine, talk to your doctor, pharmacist, or health care provider.    2016, Elsevier/Gold Standard. (2011-09-05 13:04:38)

## 2015-06-07 NOTE — ED Notes (Signed)
Pt comes in with right flank pain. Pt was seen here on 1/11 and told he had a stone. He made an appt with [facility] on Thursday. Pt verbalizes the medication he was given manages his pain but he is now out. NAD noted. Pt is able to pass urine.

## 2015-06-15 ENCOUNTER — Other Ambulatory Visit: Payer: Self-pay | Admitting: Urology

## 2015-06-17 ENCOUNTER — Encounter (HOSPITAL_COMMUNITY): Payer: Self-pay | Admitting: *Deleted

## 2015-06-21 ENCOUNTER — Ambulatory Visit (HOSPITAL_COMMUNITY)
Admission: RE | Admit: 2015-06-21 | Discharge: 2015-06-21 | Disposition: A | Discharge status: Home / Self Care | Payer: Self-pay | Source: Ambulatory Visit | Attending: Urology | Admitting: Urology

## 2015-06-21 ENCOUNTER — Ambulatory Visit (HOSPITAL_COMMUNITY): Payer: Self-pay

## 2015-06-21 ENCOUNTER — Encounter (HOSPITAL_COMMUNITY): Payer: Self-pay | Admitting: *Deleted

## 2015-06-21 ENCOUNTER — Encounter (HOSPITAL_COMMUNITY)
Admission: RE | Disposition: A | Discharge status: Home / Self Care | Payer: Self-pay | Source: Ambulatory Visit | Attending: Urology

## 2015-06-21 DIAGNOSIS — N200 Calculus of kidney: Secondary | ICD-10-CM | POA: Insufficient documentation

## 2015-06-21 DIAGNOSIS — F17210 Nicotine dependence, cigarettes, uncomplicated: Secondary | ICD-10-CM | POA: Insufficient documentation

## 2015-06-21 SURGERY — LITHOTRIPSY, ESWL
Anesthesia: LOCAL | Laterality: Right

## 2015-06-21 MED ORDER — CIPROFLOXACIN HCL 500 MG PO TABS
500.0000 mg | ORAL_TABLET | ORAL | Status: AC
  Administered 2015-06-21: 500 mg via ORAL
  Filled 2015-06-21: qty 1

## 2015-06-21 MED ORDER — SENNOSIDES-DOCUSATE SODIUM 8.6-50 MG PO TABS: 1.0000 | ORAL_TABLET | Freq: Two times a day (BID) | ORAL | Status: DC

## 2015-06-21 MED ORDER — DIPHENHYDRAMINE HCL 25 MG PO CAPS
25.0000 mg | ORAL_CAPSULE | ORAL | Status: AC
  Administered 2015-06-21: 25 mg via ORAL
  Filled 2015-06-21: qty 1

## 2015-06-21 MED ORDER — OXYCODONE HCL 10 MG PO TABS: 10.0000 mg | ORAL_TABLET | ORAL | Status: DC | PRN

## 2015-06-21 MED ORDER — DIAZEPAM 5 MG PO TABS
10.0000 mg | ORAL_TABLET | ORAL | Status: AC
  Administered 2015-06-21: 10 mg via ORAL
  Filled 2015-06-21: qty 2

## 2015-06-21 MED ORDER — SODIUM CHLORIDE 0.9 % IV SOLN
INTRAVENOUS | Status: DC
  Administered 2015-06-21: 10:00:00 via INTRAVENOUS

## 2015-06-21 NOTE — Discharge Instructions (Signed)
1 - You may have urinary urgency (bladder spasms), pass small stone fragments, and bloody urine on / off x few weeks.  This is normal.  2 - Call MD or go to ER for fever >102, severe pain / nausea / vomiting not relieved by medications, or acute change in medical status

## 2015-06-21 NOTE — H&P (Signed)
Brett Liu is an 25 y.o. male.    Chief Complaint: PRe-op Right Shockwave Lithotripsy  HPI:   1 Right UPJ Stone - Rt 8mm renal stone (SSD 8cm 830HU) by CT 05/2015 on eval flank pain. F/u US with stone likely at UPJ and with incrased colic symtpoms. Most recent UA without infectious patameter. NO interval fevers.  Today "Jakeel" is seen to proceed with shockwave lithotripsy.  Past Medical History  Diagnosis Date  . Kidney stone     History reviewed. No pertinent past surgical history.  History reviewed. No pertinent family history. Social History:  reports that he has been smoking Cigarettes.  He has been smoking about 1.00 pack per day. He has never used smokeless tobacco. He reports that he drinks about 3.0 oz of alcohol per week. He reports that he does not use illicit drugs.  Allergies: No Known Allergies  No prescriptions prior to admission    No results found for this or any previous visit (from the past 48 hour(s)). No results found.  Review of Systems  Constitutional: Negative.  Negative for fever and chills.  HENT: Negative.   Eyes: Negative.   Respiratory: Negative.   Cardiovascular: Negative.   Gastrointestinal: Positive for nausea.  Genitourinary: Positive for flank pain.  Musculoskeletal: Negative.   Skin: Negative.   Neurological: Negative.   Endo/Heme/Allergies: Negative.   Psychiatric/Behavioral: Negative.     There were no vitals taken for this visit. Physical Exam  Constitutional: He appears well-developed.  Eyes: Pupils are equal, round, and reactive to light.  Neck: Normal range of motion.  Cardiovascular: Normal rate.   Respiratory: Effort normal.  GI: Soft.  Genitourinary:  Mild Rt CVAT  Musculoskeletal: Normal range of motion.  Neurological: He is alert.  Skin: Skin is warm.  Psychiatric: He has a normal mood and affect. His behavior is normal. Judgment and thought content normal.     Assessment/Plan  1 Right UPJ Stone - We  discussed shockwave lithotripsy in detail as well as my "rule of 9s" with stones <82mm, less than 900 HU, and skin to stone distance <9cm having approximately 90% treatment success with single session of treatment. We then addressed how stones that are larger, more dense, and in patients with less favorable anatomy have incrementally decreased success rates. We discussed risks including, bleeding, infection, hematoma, loss of kidney, need for staged therapy, need for adjunctive therapy and requirement to refrain from any anticoagulants, anti-platelet or aspirin-like products peri-procedureally.   After careful consideration, the patient has chosen to proceed today as planned.   Kordell Jafri 06/21/2015, 6:04 AM

## 2015-06-21 NOTE — Brief Op Note (Signed)
06/21/2015  11:45 AM  PATIENT:  Lynnae January  25 y.o. male  PRE-OPERATIVE DIAGNOSIS:  RIGHT RENAL CALCULUS  POST-OPERATIVE DIAGNOSIS:  * No post-op diagnosis entered *  PROCEDURE:  Procedure(s): RIGHT EXTRACORPOREAL SHOCK WAVE LITHOTRIPSY (ESWL) (Right)  SURGEON:  Surgeon(s) and Role:    * Sebastian Ache, MD - Primary  PHYSICIAN ASSISTANT:   ASSISTANTS: none   ANESTHESIA:   MAC  EBL:     BLOOD ADMINISTERED:none  DRAINS: none   LOCAL MEDICATIONS USED:  NONE  SPECIMEN:  No Specimen  DISPOSITION OF SPECIMEN:  N/A  COUNTS:  YES  TOURNIQUET:  * No tourniquets in log *  DICTATION: .Note written in paper chart  PLAN OF CARE: Discharge to home after PACU  PATIENT DISPOSITION:  Short Stay   Delay start of Pharmacological VTE agent (>24hrs) due to surgical blood loss or risk of bleeding: yes

## 2015-12-28 ENCOUNTER — Emergency Department (HOSPITAL_COMMUNITY)
Admission: EM | Admit: 2015-12-28 | Discharge: 2015-12-28 | Disposition: A | Discharge status: Home / Self Care | Payer: Self-pay | Attending: Emergency Medicine | Admitting: Emergency Medicine

## 2015-12-28 ENCOUNTER — Encounter (HOSPITAL_COMMUNITY): Payer: Self-pay | Admitting: *Deleted

## 2015-12-28 DIAGNOSIS — K047 Periapical abscess without sinus: Secondary | ICD-10-CM | POA: Insufficient documentation

## 2015-12-28 DIAGNOSIS — K029 Dental caries, unspecified: Secondary | ICD-10-CM | POA: Insufficient documentation

## 2015-12-28 DIAGNOSIS — F17210 Nicotine dependence, cigarettes, uncomplicated: Secondary | ICD-10-CM | POA: Insufficient documentation

## 2015-12-28 MED ORDER — TRAMADOL HCL 50 MG PO TABS: 100.0000 mg | ORAL_TABLET | Freq: Four times a day (QID) | ORAL | 0 refills | Status: DC | PRN

## 2015-12-28 MED ORDER — PENICILLIN V POTASSIUM 250 MG PO TABS
500.0000 mg | ORAL_TABLET | Freq: Once | ORAL | Status: AC
  Administered 2015-12-28: 500 mg via ORAL
  Filled 2015-12-28: qty 2

## 2015-12-28 MED ORDER — TRAMADOL HCL 50 MG PO TABS
100.0000 mg | ORAL_TABLET | Freq: Once | ORAL | Status: AC
  Administered 2015-12-28: 100 mg via ORAL
  Filled 2015-12-28: qty 2

## 2015-12-28 MED ORDER — ACETAMINOPHEN 500 MG PO TABS
1000.0000 mg | ORAL_TABLET | Freq: Once | ORAL | Status: AC
  Administered 2015-12-28: 1000 mg via ORAL
  Filled 2015-12-28: qty 2

## 2015-12-28 MED ORDER — PENICILLIN V POTASSIUM 500 MG PO TABS: 500.0000 mg | ORAL_TABLET | Freq: Four times a day (QID) | ORAL | 0 refills | Status: AC

## 2015-12-28 NOTE — ED Triage Notes (Signed)
Pt c/o pain and swelling to upper and lower teeth to right side

## 2015-12-28 NOTE — ED Provider Notes (Addendum)
[facility] Provider Note   CSN: [id number] Arrival date & time: 12/28/15  [id number]  First Provider Contact:  First MD Initiated Contact with Patient 12/28/15 0530        History   Chief Complaint Chief Complaint  Patient presents with  . Dental Pain    HPI Brett Liu is a 25 y.o. male.  HPI  patient is complaining of pain in his upper and lower molars on the right side over the last couple days and reports some facial swelling has started tonight. He denies any difficulty swallowing or breathing, he is unaware of fever. He does not have a dentist.  PCP none  Past Medical History:  Diagnosis Date  . Kidney stone     There are no active problems to display for this patient.   History reviewed. No pertinent surgical history.     Home Medications    Prior to Admission medications   Medication Sig Start Date End Date Taking? Authorizing Provider  penicillin v potassium (VEETID) 500 MG tablet Take 1 tablet (500 mg total) by mouth 4 (four) times daily. 12/28/15 01/04/16  Devoria AlbeIva Teaghan Melrose, MD  traMADol (ULTRAM) 50 MG tablet Take 2 tablets (100 mg total) by mouth every 6 (six) hours as needed. 12/28/15   Devoria AlbeIva Ireanna Finlayson, MD    Family History History reviewed. No pertinent family history.  Social History Social History  Substance Use Topics  . Smoking status: Current Every Day Smoker    Packs/day: 1.00    Types: Cigarettes  . Smokeless tobacco: Never Used  . Alcohol use 3.0 oz/week    5 Shots of liquor per week     Comment: occasional      Allergies   Review of patient's allergies indicates no known allergies.   Review of Systems Review of Systems  All other systems reviewed and are negative.    Physical Exam Updated Vital Signs BP (!) 154/105 (BP Location: Left Arm)   Pulse 77   Temp 98.7 F (37.1 C) (Oral)   Resp 18   Ht 5\' 6"  (1.676 m)   Wt 142 lb (64.4 kg)   SpO2 100%   BMI 22.92 kg/m   Vital signs normal except for hypertension   Physical  Exam  Constitutional: He is oriented to person, place, and time. He appears well-developed and well-nourished.  Non-toxic appearance. He does not appear ill. No distress.  HENT:  Head: Normocephalic and atraumatic.  Right Ear: External ear normal.  Left Ear: External ear normal.  Nose: Nose normal. No mucosal edema or rhinorrhea.  Mouth/Throat: Mucous membranes are normal. No dental abscesses or uvula swelling.    Patient has mild facial swelling on the right side of his face in the area of the affected tooth, there is no redness to the skin.  Eyes: Conjunctivae and EOM are normal.  Neck: Normal range of motion and full passive range of motion without pain.  Cardiovascular: Normal rate.   Pulmonary/Chest: Effort normal. No respiratory distress. He has no rhonchi. He exhibits no crepitus.  Abdominal: Normal appearance. There is no rebound.  Musculoskeletal: Normal range of motion.  Moves all extremities well.   Neurological: He is alert and oriented to person, place, and time. He has normal strength. No cranial nerve deficit.  Skin: Skin is warm, dry and intact. No rash noted. No erythema. No pallor.  Psychiatric: He has a normal mood and affect. His speech is normal and behavior is normal. His mood appears not anxious.  Nursing  note and vitals reviewed.    ED Treatments / Results   Procedures Procedures (including critical care time)  Medications Ordered in ED Medications  penicillin v potassium (VEETID) tablet 500 mg (not administered)  traMADol (ULTRAM) tablet 100 mg (not administered)  acetaminophen (TYLENOL) tablet 1,000 mg (not administered)     Initial Impression / Assessment and Plan / ED Course  I have reviewed the triage vital signs and the nursing notes.  Pertinent labs & imaging results that were available during my care of the patient were reviewed by me and considered in my medical decision making (see chart for details).  Clinical Course   Patient was  started on potassium 4 times a day with tramadol and acetaminophen for pain. He was advised to not put heat on his face. He should contact his dentist to have him recheck his tooth probably next week.  Review of the [place] shows no entries for the past 6 months  Final Clinical Impressions(s) / ED Diagnoses   Final diagnoses:  Abscessed tooth  Dental caries    New Prescriptions New Prescriptions   PENICILLIN V POTASSIUM (VEETID) 500 MG TABLET    Take 1 tablet (500 mg total) by mouth 4 (four) times daily.   TRAMADOL (ULTRAM) 50 MG TABLET    Take 2 tablets (100 mg total) by mouth every 6 (six) hours as needed.    Plan discharge  Devoria Albe, MD, Concha Pyo, MD 12/28/15 4010    Devoria Albe, MD 01/25/16 [phone]

## 2015-12-28 NOTE — Discharge Instructions (Signed)
Take the medications as prescribed with acetaminophen 1000 mg + motrin 600 mg 4 times a day. Call your dentist to be rechecked.  Return to the ED if you get a fever, worsening swelling, have trouble breathing or swallowing.

## 2015-12-28 NOTE — ED Notes (Signed)
Pt alert & oriented x4, stable gait. Patient given discharge instructions, paperwork & prescription(s). Patient  instructed to stop at the registration desk to finish any additional paperwork. Patient verbalized understanding. Pt left department w/ no further questions. 

## 2016-01-25 ENCOUNTER — Encounter (HOSPITAL_COMMUNITY): Payer: Self-pay | Admitting: *Deleted

## 2016-01-25 ENCOUNTER — Emergency Department (HOSPITAL_COMMUNITY)
Admission: EM | Admit: 2016-01-25 | Discharge: 2016-01-25 | Disposition: A | Discharge status: Home / Self Care | Payer: Self-pay | Attending: Emergency Medicine | Admitting: Emergency Medicine

## 2016-01-25 DIAGNOSIS — F419 Anxiety disorder, unspecified: Secondary | ICD-10-CM

## 2016-01-25 DIAGNOSIS — F17210 Nicotine dependence, cigarettes, uncomplicated: Secondary | ICD-10-CM | POA: Insufficient documentation

## 2016-01-25 DIAGNOSIS — F410 Panic disorder [episodic paroxysmal anxiety] without agoraphobia: Secondary | ICD-10-CM | POA: Insufficient documentation

## 2016-01-25 MED ORDER — HYDROXYZINE HCL 25 MG PO TABS: 25.0000 mg | ORAL_TABLET | Freq: Four times a day (QID) | ORAL | 0 refills | Status: DC | PRN

## 2016-01-25 NOTE — ED Triage Notes (Signed)
Pt c/o panic attacks when he wakes up from naps or in the am; pt states he tried to go to work this am but his panic attack was causing him to feel upset

## 2016-01-25 NOTE — ED Provider Notes (Signed)
[facility] Provider Note   CSN: [id number] Arrival date & time: 01/25/16  0615     History   Chief Complaint Chief Complaint  Patient presents with  . Panic Attack    HPI Brett Liu is a 25 y.o. male.  He presents for evaluation of "panic attack". He states that for the last year has been having intermittent episodes of anxiety. The last week it has been worse.  Every time that he wakes up, be it  the morning, or from a nap he will panic. He states was hyperventilating. He wasn't sure what to do so he presents to the emergency room. His symptoms have improved.  HPI  Past Medical History:  Diagnosis Date  . Kidney stone     There are no active problems to display for this patient.   Past Surgical History:  Procedure Laterality Date  . LITHOTRIPSY         Home Medications    Prior to Admission medications   Medication Sig Start Date End Date Taking? Authorizing Provider  hydrOXYzine (ATARAX/VISTARIL) 25 MG tablet Take 1 tablet (25 mg total) by mouth every 6 (six) hours as needed for anxiety. 01/25/16   Rolland Porter, MD  traMADol (ULTRAM) 50 MG tablet Take 2 tablets (100 mg total) by mouth every 6 (six) hours as needed. 12/28/15   Devoria Albe, MD    Family History History reviewed. No pertinent family history.  Social History Social History  Substance Use Topics  . Smoking status: Current Every Day Smoker    Packs/day: 1.00    Types: Cigarettes  . Smokeless tobacco: Never Used  . Alcohol use 3.0 oz/week    5 Shots of liquor per week     Comment: occasional      Allergies   Review of patient's allergies indicates no known allergies.   Review of Systems Review of Systems  Constitutional: Negative for appetite change, chills, diaphoresis, fatigue and fever.  HENT: Negative for mouth sores, sore throat and trouble swallowing.   Eyes: Negative for visual disturbance.  Respiratory: Negative for cough, chest tightness, shortness of breath and  wheezing.   Cardiovascular: Negative for chest pain.  Gastrointestinal: Negative for abdominal distention, abdominal pain, diarrhea, nausea and vomiting.  Endocrine: Negative for polydipsia, polyphagia and polyuria.  Genitourinary: Negative for dysuria, frequency and hematuria.  Musculoskeletal: Negative for gait problem.  Skin: Negative for color change, pallor and rash.  Neurological: Negative for dizziness, syncope, light-headedness and headaches.  Hematological: Does not bruise/bleed easily.  Psychiatric/Behavioral: Negative for behavioral problems and confusion. The patient is nervous/anxious.      Physical Exam Updated Vital Signs BP (!) 161/107 (BP Location: Left Arm)   Pulse 86   Temp 98.2 F (36.8 C) (Oral)   Resp 18   Ht 5\' 6"  (1.676 m)   Wt 145 lb (65.8 kg)   SpO2 100%   BMI 23.40 kg/m   Physical Exam  Constitutional: He is oriented to person, place, and time. He appears well-developed and well-nourished. No distress.  HENT:  Head: Normocephalic.  Eyes: Conjunctivae are normal. Pupils are equal, round, and reactive to light. No scleral icterus.  Neck: Normal range of motion. Neck supple. No thyromegaly present.  Cardiovascular: Normal rate and regular rhythm.  Exam reveals no gallop and no friction rub.   No murmur heard. Pulmonary/Chest: Effort normal and breath sounds normal. No respiratory distress. He has no wheezes. He has no rales.  Abdominal: Soft. Bowel sounds are normal. He exhibits  no distension. There is no tenderness. There is no rebound.  Musculoskeletal: Normal range of motion.  Neurological: He is alert and oriented to person, place, and time.  Skin: Skin is warm and dry. No rash noted.  Psychiatric: He has a normal mood and affect. His behavior is normal.  Patient is calm. Resting heart rate 85. Not hyperventilating. Conversant.     ED Treatments / Results  Labs (all labs ordered are listed, but only abnormal results are displayed) Labs  Reviewed - No data to display  EKG  EKG Interpretation None       Radiology No results found.  Procedures Procedures (including critical care time)  Medications Ordered in ED Medications - No data to display   Initial Impression / Assessment and Plan / ED Course  I have reviewed the triage vital signs and the nursing notes.  Pertinent labs & imaging results that were available during my care of the patient were reviewed by me and considered in my medical decision making (see chart for details).  Clinical Course    Have recommended that he obtain a primary care physician to discuss treatment of any future pain anxiety or panic attacks.  Prescription for Atarax to use when necessary in the meanwhile  Final Clinical Impressions(s) / ED Diagnoses   Final diagnoses:  Panic attack  Anxiety    New Prescriptions New Prescriptions   HYDROXYZINE (ATARAX/VISTARIL) 25 MG TABLET    Take 1 tablet (25 mg total) by mouth every 6 (six) hours as needed for anxiety.     Rolland PorterMark Lang Zingg, MD 01/25/16 [phone]

## 2016-02-09 ENCOUNTER — Encounter (HOSPITAL_COMMUNITY): Payer: Self-pay | Admitting: Emergency Medicine

## 2016-02-09 ENCOUNTER — Emergency Department (HOSPITAL_COMMUNITY)
Admission: EM | Admit: 2016-02-09 | Discharge: 2016-02-09 | Disposition: A | Discharge status: Home / Self Care | Payer: Self-pay | Attending: Emergency Medicine | Admitting: Emergency Medicine

## 2016-02-09 DIAGNOSIS — F17210 Nicotine dependence, cigarettes, uncomplicated: Secondary | ICD-10-CM | POA: Insufficient documentation

## 2016-02-09 DIAGNOSIS — T401X4A Poisoning by heroin, undetermined, initial encounter: Secondary | ICD-10-CM

## 2016-02-09 DIAGNOSIS — R454 Irritability and anger: Secondary | ICD-10-CM

## 2016-02-09 DIAGNOSIS — Z79899 Other long term (current) drug therapy: Secondary | ICD-10-CM | POA: Insufficient documentation

## 2016-02-09 HISTORY — DX: Other psychoactive substance abuse, uncomplicated: F19.10

## 2016-02-09 LAB — RAPID URINE DRUG SCREEN, HOSP PERFORMED
AMPHETAMINES: NOT DETECTED
BARBITURATES: POSITIVE — AB
Benzodiazepines: NOT DETECTED
Cocaine: NOT DETECTED
OPIATES: NOT DETECTED
TETRAHYDROCANNABINOL: POSITIVE — AB

## 2016-02-09 LAB — COMPREHENSIVE METABOLIC PANEL
ALT: 14 U/L — ABNORMAL LOW (ref 17–63)
ANION GAP: 5 (ref 5–15)
AST: 17 U/L (ref 15–41)
Albumin: 4.8 g/dL (ref 3.5–5.0)
Alkaline Phosphatase: 45 U/L (ref 38–126)
BUN: 12 mg/dL (ref 6–20)
CHLORIDE: 105 mmol/L (ref 101–111)
CO2: 28 mmol/L (ref 22–32)
Calcium: 9.4 mg/dL (ref 8.9–10.3)
Creatinine, Ser: 0.81 mg/dL (ref 0.61–1.24)
GFR calc non Af Amer: >60 mL/min (ref >60)
Glucose, Bld: 103 mg/dL — ABNORMAL HIGH (ref 65–99)
POTASSIUM: 3.5 mmol/L (ref 3.5–5.1)
SODIUM: 138 mmol/L (ref 135–145)
Total Bilirubin: 0.5 mg/dL (ref 0.3–1.2)
Total Protein: 8.6 g/dL — ABNORMAL HIGH (ref 6.5–8.1)

## 2016-02-09 LAB — CBC WITH DIFFERENTIAL/PLATELET
BASOS ABS: 0 10*3/uL (ref 0.0–0.1)
BASOS PCT: 0 %
EOS ABS: 0.1 10*3/uL (ref 0.0–0.7)
Eosinophils Relative: 1 %
HCT: 44.1 % (ref 39.0–52.0)
Hemoglobin: 15.3 g/dL (ref 13.0–17.0)
Lymphocytes Relative: 17 %
Lymphs Abs: 2.1 10*3/uL (ref 0.7–4.0)
MCH: 30.6 pg (ref 26.0–34.0)
MCHC: 34.7 g/dL (ref 30.0–36.0)
MCV: 88.2 fL (ref 78.0–100.0)
MONO ABS: 1.1 10*3/uL — AB (ref 0.1–1.0)
MONOS PCT: 9 %
NEUTROS PCT: 73 %
Neutro Abs: 9 10*3/uL — ABNORMAL HIGH (ref 1.7–7.7)
PLATELETS: 246 10*3/uL (ref 150–400)
RBC: 5 MIL/uL (ref 4.22–5.81)
RDW: 12 % (ref 11.5–15.5)
WBC: 12.3 10*3/uL — ABNORMAL HIGH (ref 4.0–10.5)

## 2016-02-09 LAB — CBG MONITORING, ED: GLUCOSE-CAPILLARY: 99 mg/dL (ref 65–99)

## 2016-02-09 MED ORDER — CLONIDINE HCL 0.1 MG PO TABS: ORAL_TABLET | ORAL | 0 refills | Status: AC

## 2016-02-09 MED ORDER — ONDANSETRON HCL 4 MG PO TABS: 4.0000 mg | ORAL_TABLET | Freq: Four times a day (QID) | ORAL | 0 refills | Status: AC

## 2016-02-09 MED ORDER — SODIUM CHLORIDE 0.9 % IV BOLUS (SEPSIS)
1000.0000 mL | Freq: Once | INTRAVENOUS | Status: AC
  Administered 2016-02-09: 1000 mL via INTRAVENOUS

## 2016-02-09 MED ORDER — SODIUM CHLORIDE 0.9 % IV BOLUS (SEPSIS): 1000.0000 mL | Freq: Once | INTRAVENOUS | Status: DC

## 2016-02-09 NOTE — ED Notes (Signed)
Mother at the beside, pt wanting to talk with her alone.
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
## 2016-02-09 NOTE — ED Notes (Signed)
Pt pulled IV out yelling at Mother
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
## 2016-07-28 IMAGING — CR DG ABDOMEN 1V
1 series · 1 of 1 positions shown · non-contrast
Comparison: 06/10/2015.

CLINICAL DATA: Right renal calculus.

EXAM:
ABDOMEN - 1 VIEW

[t abdomen supine]
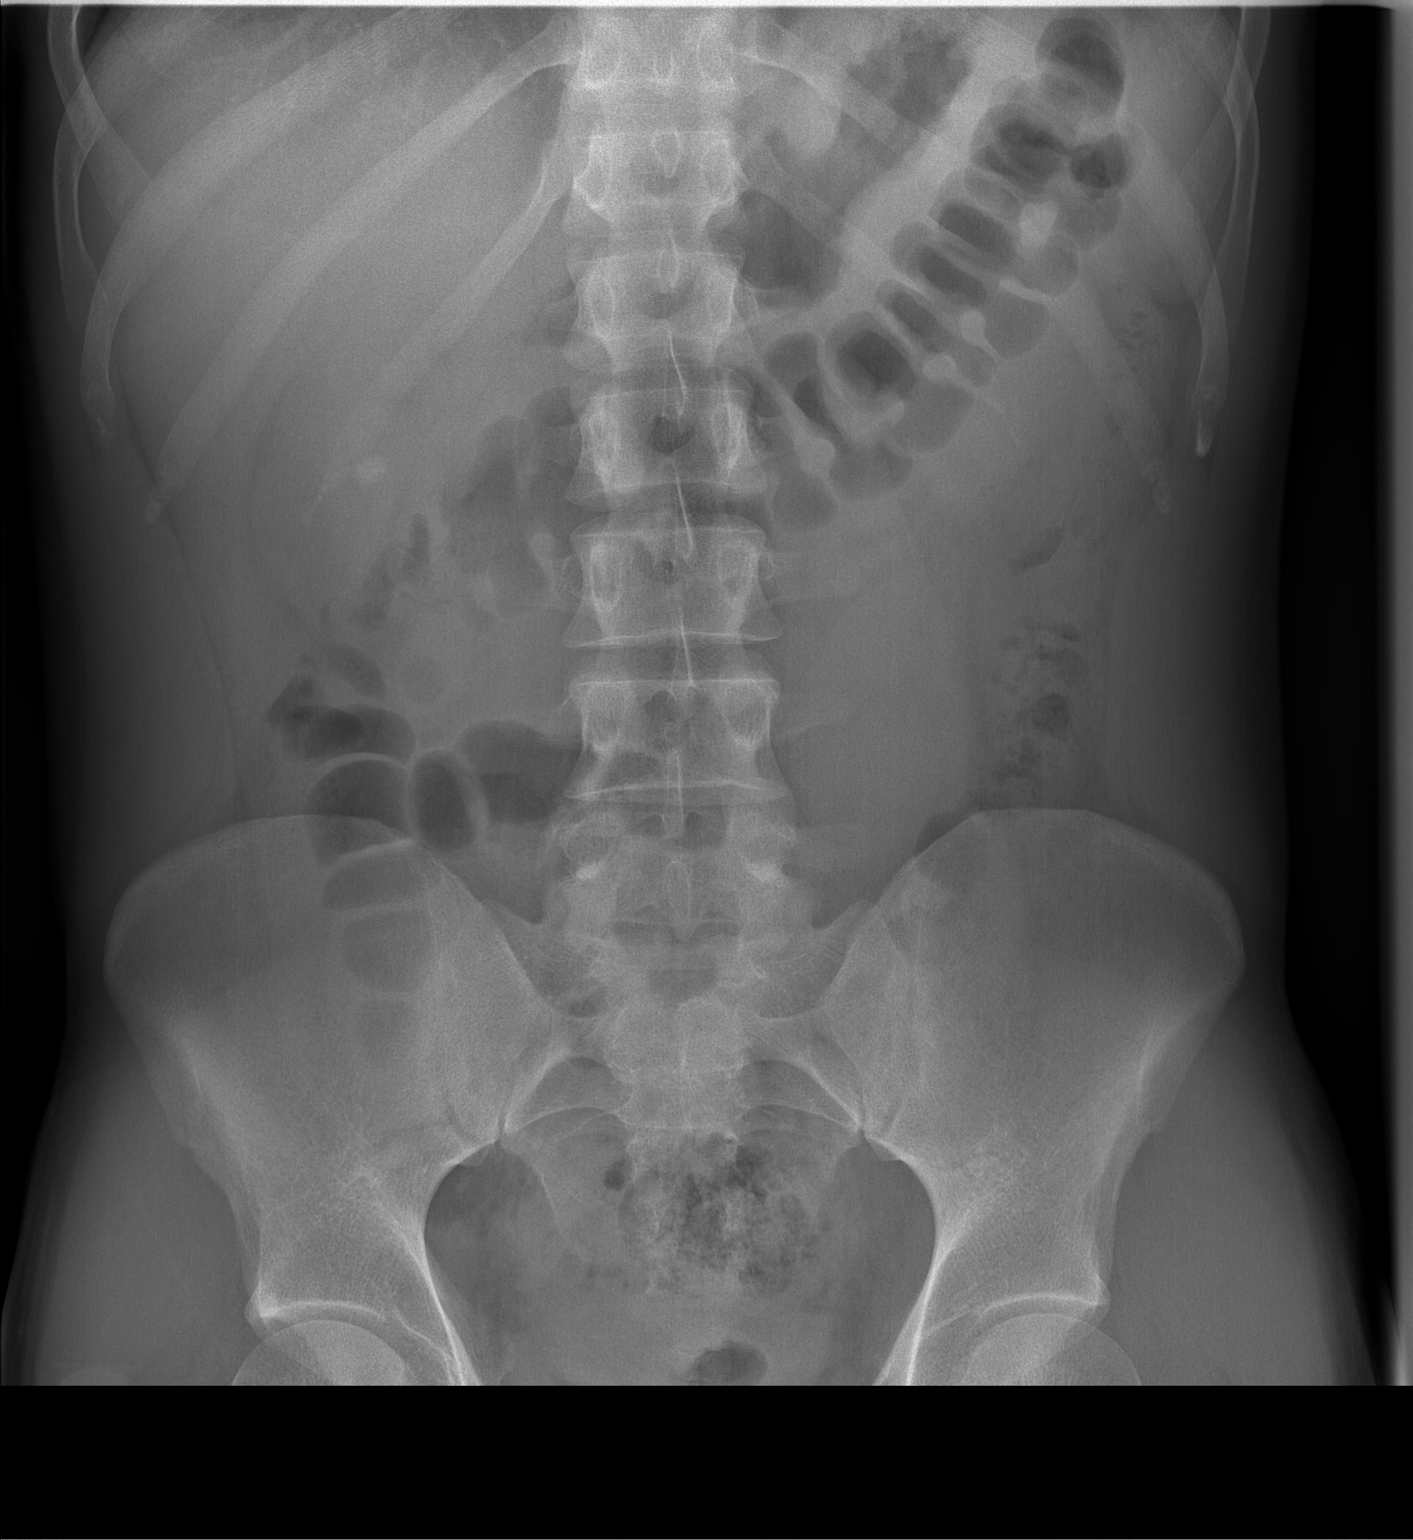

[1 of 1 positions shown; findings below may reference images not displayed]

FINDINGS: Right renal calculus is stable. No evidence of ureteral calculus. No
bowel distention. No acute bony abnormality .
IMPRESSION: Stable right renal calculus.  No evidence of urolithiasis.

## 2016-12-05 IMAGING — US US RENAL
1 series · 14 of 25 positions shown · non-contrast
Comparison: CT scan June 02, 2015

CLINICAL DATA: Right flank pain.  Known renal stones.

EXAM:
RENAL / URINARY TRACT ULTRASOUND COMPLETE

[Series 1: us renal · 0.22mm/px · 14 of 54 slices shown]
[im 1/54]
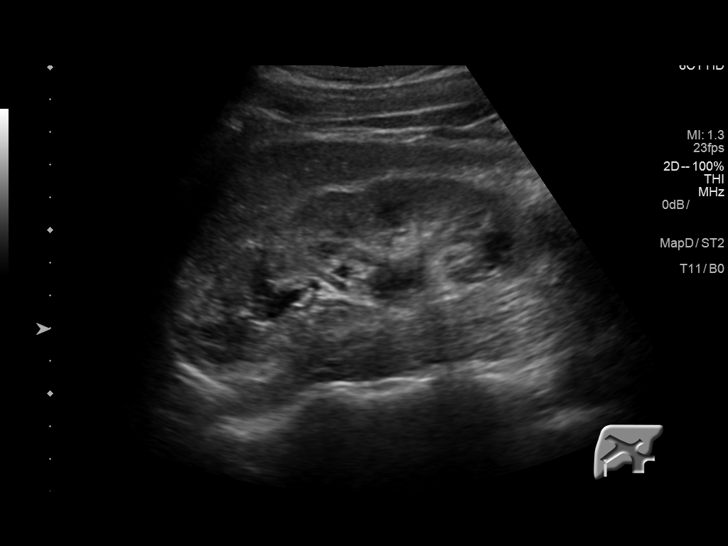
[im 5/54]
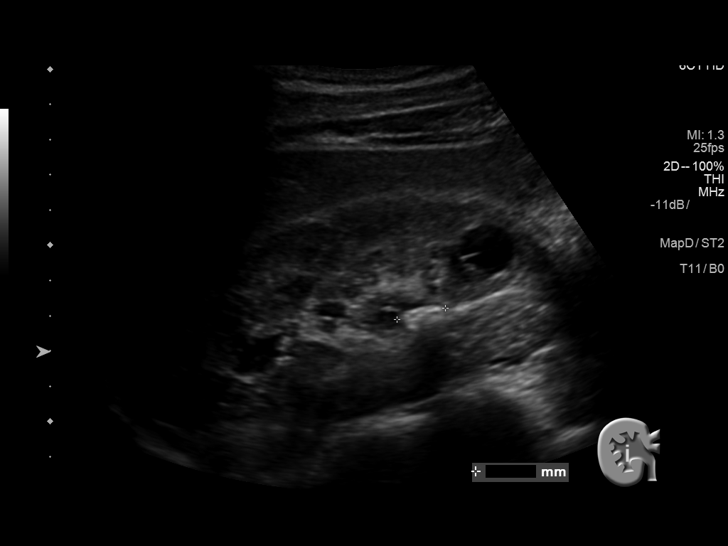
[im 9/54]
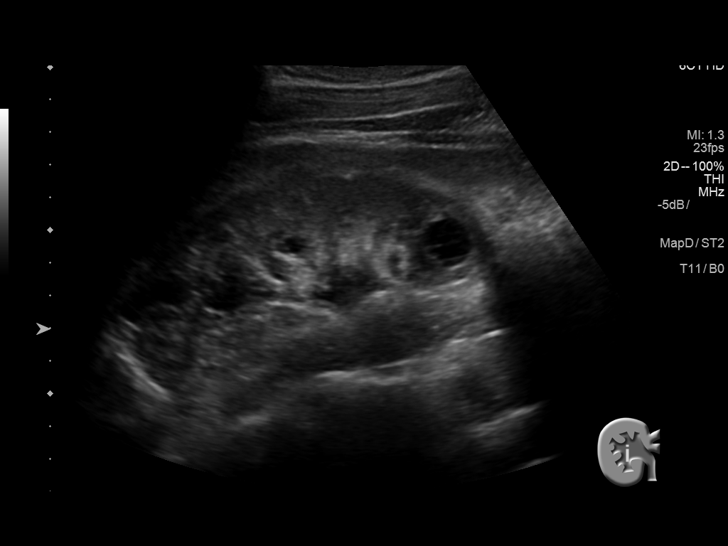
[im 14/54]
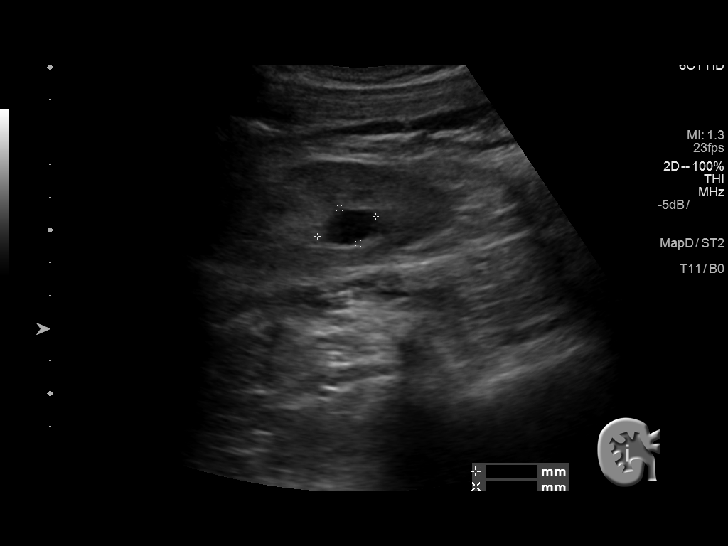
[im 18/54]
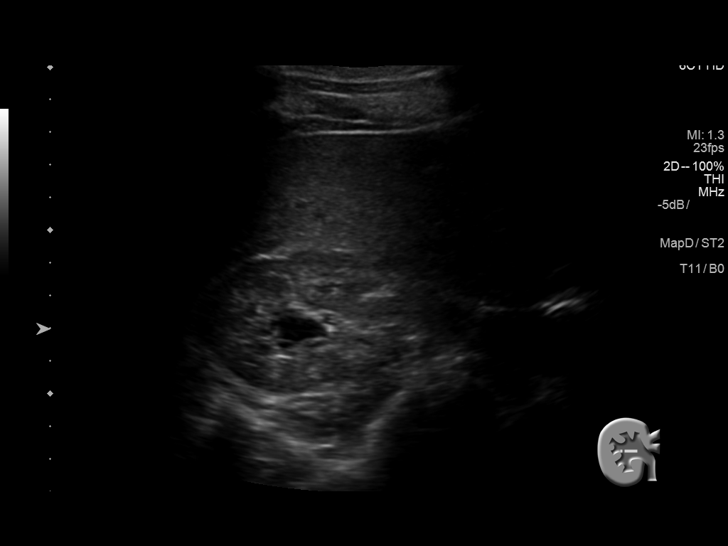
[im 20/54]
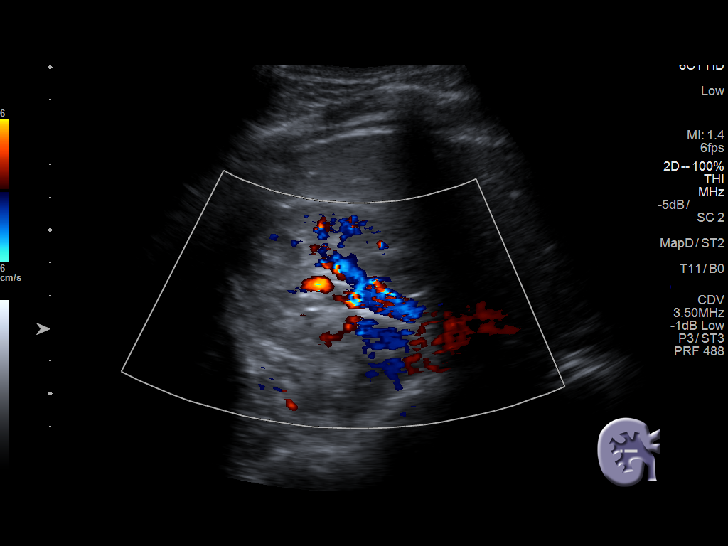
[im 25/54]
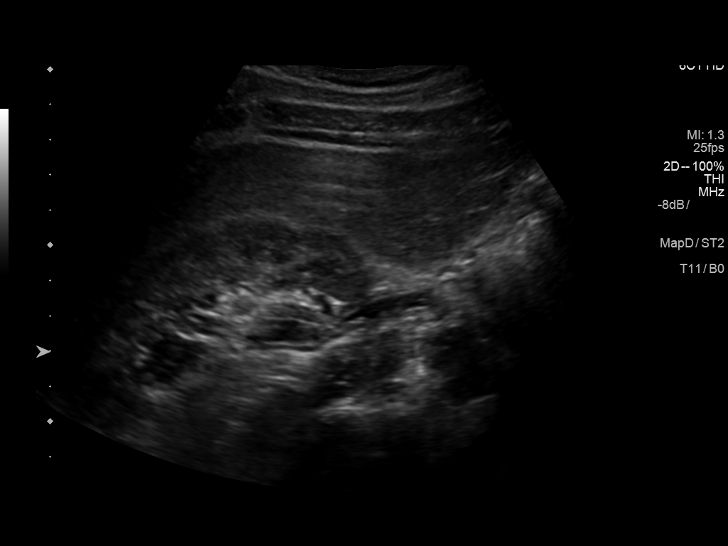
[im 29/54]
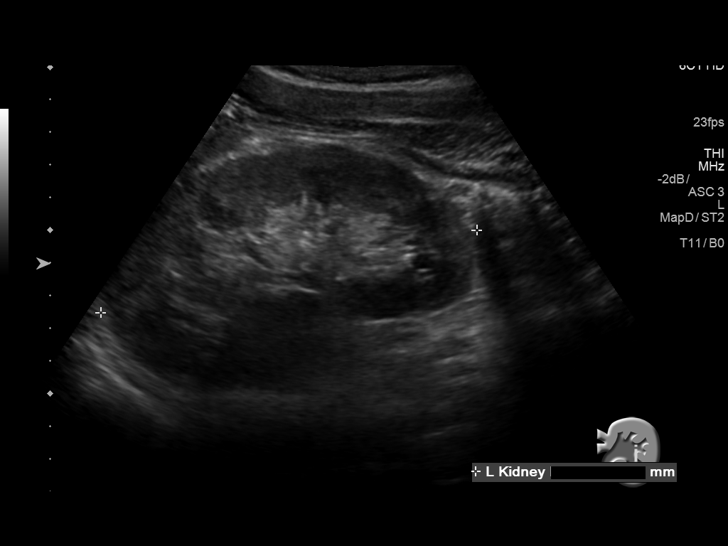
[im 34/54]
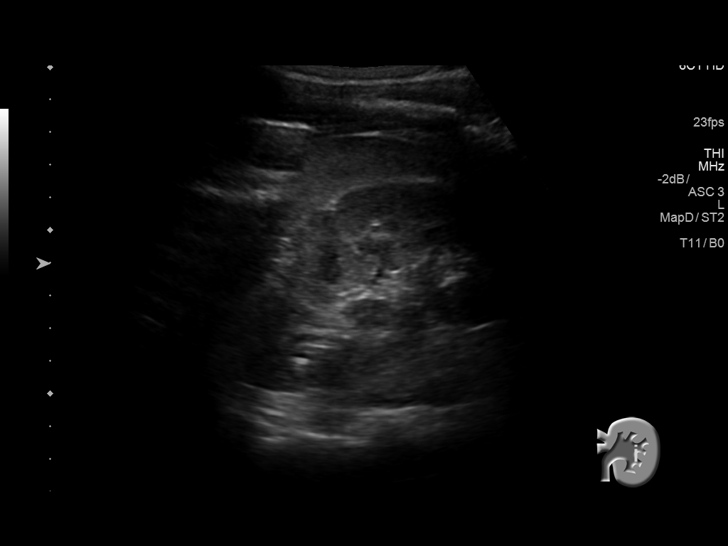
[im 36/54]
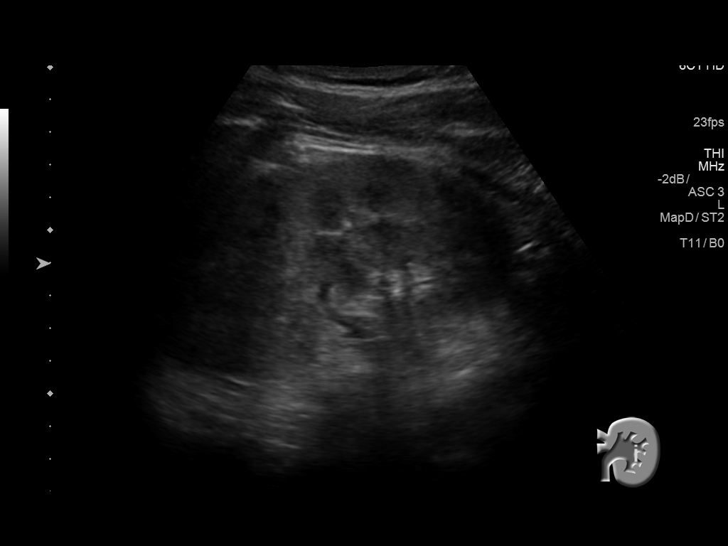
[im 40/54]
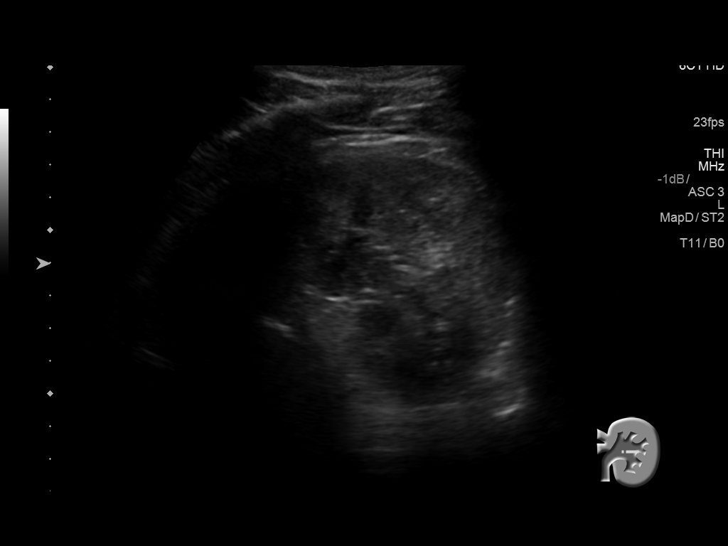
[im 45/54]
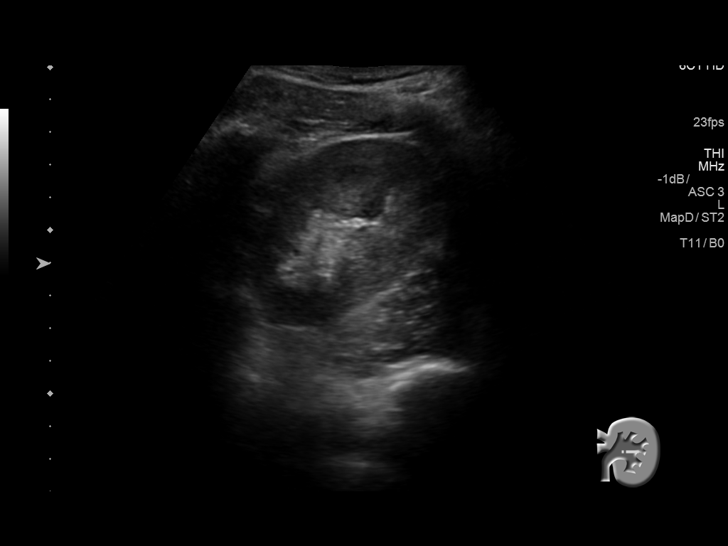
[im 49/54]
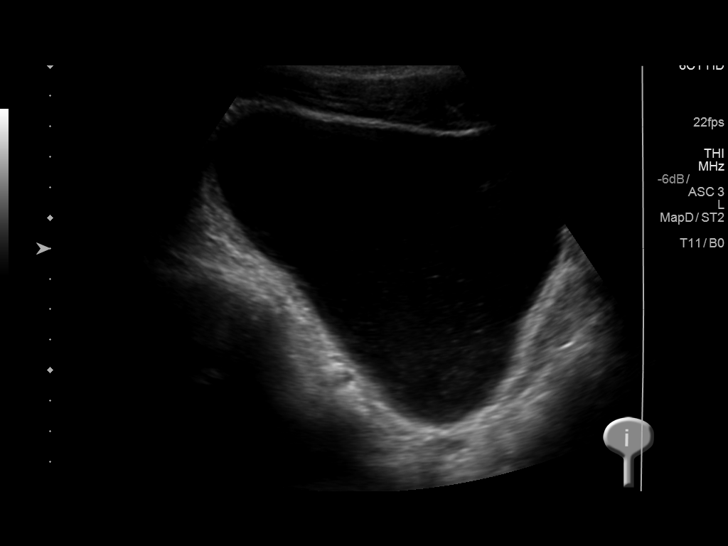
[im 54/54]
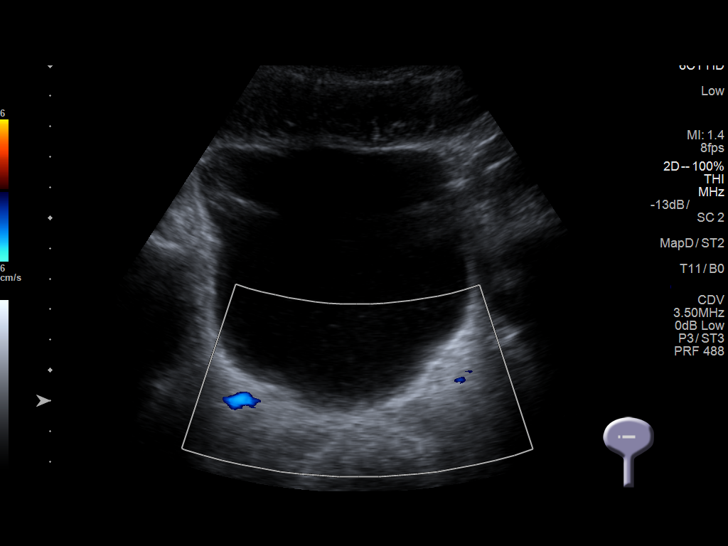

[14 of 25 positions shown; findings below may reference images not displayed]

FINDINGS: Right Kidney:

Length: 11.9 cm. The patient's known renal stone is located near the
UPJ, measuring 14 x 13 mm. There is mild prominence of a few
calices, not seen on the recent CT scan. Three small cysts are seen
in the right kidney. The cystic structure in the mid right kidney
contains a single septation measuring 17 x 16 x 20 mm.

Left Kidney:

Length: 11.8. Possible 5 mm stone in the left kidney, not seen on
the recent CT scan. It is possible this is artifactual. No
hydronephrosis or mass.

Bladder:

There is debris within the bladder with no focal masses. Ureteral
jets were not identified.
IMPRESSION: 1. The 14 x 13 mm stone seen in the right kidney on the recent CT
scan is now located near the UPJ with mild prominence of the
calices, not seen on the previous study.
2. Right renal cysts, 1 of which contains a single septation.
